# Patient Record
Sex: Female | Born: 1975 | Race: White | Hispanic: No | Marital: Single | Smoking: Never smoker
Health system: Southern US, Community
[De-identification: ages and names within clinical notes are randomized; demographics above are authoritative.]

## PROBLEM LIST (undated history)

## (undated) DIAGNOSIS — R0989 Other specified symptoms and signs involving the circulatory and respiratory systems: Secondary | ICD-10-CM

## (undated) DIAGNOSIS — I519 Heart disease, unspecified: Secondary | ICD-10-CM

## (undated) DIAGNOSIS — D472 Monoclonal gammopathy: Secondary | ICD-10-CM

## (undated) DIAGNOSIS — I351 Nonrheumatic aortic (valve) insufficiency: Secondary | ICD-10-CM

## (undated) DIAGNOSIS — Q231 Congenital insufficiency of aortic valve: Secondary | ICD-10-CM

## (undated) DIAGNOSIS — I77810 Thoracic aortic ectasia: Secondary | ICD-10-CM

## (undated) DIAGNOSIS — I2699 Other pulmonary embolism without acute cor pulmonale: Secondary | ICD-10-CM

## (undated) DIAGNOSIS — D229 Melanocytic nevi, unspecified: Secondary | ICD-10-CM

## (undated) DIAGNOSIS — L719 Rosacea, unspecified: Secondary | ICD-10-CM

## (undated) DIAGNOSIS — N904 Leukoplakia of vulva: Secondary | ICD-10-CM

## (undated) DIAGNOSIS — I10 Essential (primary) hypertension: Secondary | ICD-10-CM

## (undated) DIAGNOSIS — Q2381 Bicuspid aortic valve: Secondary | ICD-10-CM

## (undated) DIAGNOSIS — I350 Nonrheumatic aortic (valve) stenosis: Secondary | ICD-10-CM

## (undated) DIAGNOSIS — H6690 Otitis media, unspecified, unspecified ear: Secondary | ICD-10-CM

## (undated) DIAGNOSIS — E119 Type 2 diabetes mellitus without complications: Secondary | ICD-10-CM

## (undated) DIAGNOSIS — E669 Obesity, unspecified: Secondary | ICD-10-CM

## (undated) DIAGNOSIS — K589 Irritable bowel syndrome without diarrhea: Secondary | ICD-10-CM

## (undated) DIAGNOSIS — Q969 Turner's syndrome, unspecified: Secondary | ICD-10-CM

## (undated) HISTORY — DX: Nonrheumatic aortic (valve) insufficiency: I35.1

## (undated) HISTORY — DX: Other pulmonary embolism without acute cor pulmonale: I26.99

## (undated) HISTORY — DX: Bicuspid aortic valve: Q23.81

## (undated) HISTORY — DX: Nonrheumatic aortic (valve) stenosis: I35.0

## (undated) HISTORY — DX: Essential (primary) hypertension: I10

## (undated) HISTORY — DX: Congenital insufficiency of aortic valve: Q23.1

## (undated) HISTORY — DX: Leukoplakia of vulva: N90.4

## (undated) HISTORY — DX: Type 2 diabetes mellitus without complications: E11.9

## (undated) HISTORY — DX: Monoclonal gammopathy: D47.2

## (undated) HISTORY — DX: Melanocytic nevi, unspecified: D22.9

## (undated) HISTORY — DX: Obesity, unspecified: E66.9

## (undated) HISTORY — DX: Irritable bowel syndrome, unspecified: K58.9

## (undated) HISTORY — DX: Heart disease, unspecified: I51.9

## (undated) HISTORY — DX: Other specified symptoms and signs involving the circulatory and respiratory systems: R09.89

## (undated) HISTORY — DX: Otitis media, unspecified, unspecified ear: H66.90

## (undated) HISTORY — DX: Rosacea, unspecified: L71.9

## (undated) HISTORY — DX: Turner's syndrome, unspecified: Q96.9

## (undated) HISTORY — DX: Thoracic aortic ectasia: I77.810

---

## 1999-08-11 ENCOUNTER — Encounter: Admission: RE | Admit: 1999-08-11 | Discharge: 1999-11-09 | Payer: Self-pay | Admitting: Internal Medicine

## 1999-12-30 ENCOUNTER — Encounter: Admission: RE | Admit: 1999-12-30 | Discharge: 2000-03-29 | Payer: Self-pay | Admitting: Internal Medicine

## 2000-08-11 ENCOUNTER — Encounter: Admission: RE | Admit: 2000-08-11 | Discharge: 2000-11-09 | Payer: Self-pay | Admitting: Internal Medicine

## 2004-01-28 ENCOUNTER — Other Ambulatory Visit: Admission: RE | Admit: 2004-01-28 | Discharge: 2004-01-28 | Payer: Self-pay | Admitting: Internal Medicine

## 2005-03-02 ENCOUNTER — Other Ambulatory Visit: Admission: RE | Admit: 2005-03-02 | Discharge: 2005-03-02 | Payer: Self-pay | Admitting: Obstetrics and Gynecology

## 2006-03-29 ENCOUNTER — Other Ambulatory Visit: Admission: RE | Admit: 2006-03-29 | Discharge: 2006-03-29 | Payer: Self-pay | Admitting: Obstetrics and Gynecology

## 2007-04-04 ENCOUNTER — Other Ambulatory Visit: Admission: RE | Admit: 2007-04-04 | Discharge: 2007-04-04 | Payer: Self-pay | Admitting: Obstetrics and Gynecology

## 2008-04-09 ENCOUNTER — Other Ambulatory Visit: Admission: RE | Admit: 2008-04-09 | Discharge: 2008-04-09 | Payer: Self-pay | Admitting: Obstetrics and Gynecology

## 2009-04-12 ENCOUNTER — Other Ambulatory Visit: Admission: RE | Admit: 2009-04-12 | Discharge: 2009-04-12 | Payer: Self-pay | Admitting: Obstetrics and Gynecology

## 2010-05-09 ENCOUNTER — Other Ambulatory Visit: Admission: RE | Admit: 2010-05-09 | Discharge: 2010-05-09 | Payer: Self-pay | Admitting: Obstetrics and Gynecology

## 2011-01-01 ENCOUNTER — Other Ambulatory Visit: Payer: Self-pay | Admitting: Dermatology

## 2011-03-02 ENCOUNTER — Other Ambulatory Visit: Payer: Self-pay | Admitting: Dermatology

## 2011-05-11 ENCOUNTER — Other Ambulatory Visit (HOSPITAL_COMMUNITY)
Admission: RE | Admit: 2011-05-11 | Discharge: 2011-05-11 | Disposition: A | Discharge status: Home / Self Care | Payer: BC Managed Care – PPO | Source: Ambulatory Visit | Attending: Obstetrics and Gynecology | Admitting: Obstetrics and Gynecology

## 2011-05-11 ENCOUNTER — Other Ambulatory Visit: Payer: Self-pay | Admitting: Obstetrics and Gynecology

## 2011-05-11 DIAGNOSIS — Z01419 Encounter for gynecological examination (general) (routine) without abnormal findings: Secondary | ICD-10-CM | POA: Insufficient documentation

## 2012-03-04 ENCOUNTER — Other Ambulatory Visit: Payer: Self-pay | Admitting: Dermatology

## 2012-06-02 ENCOUNTER — Other Ambulatory Visit (HOSPITAL_COMMUNITY)
Admission: RE | Admit: 2012-06-02 | Discharge: 2012-06-02 | Disposition: A | Discharge status: Home / Self Care | Payer: BC Managed Care – PPO | Source: Ambulatory Visit | Attending: Obstetrics and Gynecology | Admitting: Obstetrics and Gynecology

## 2012-06-02 ENCOUNTER — Other Ambulatory Visit: Payer: Self-pay | Admitting: Obstetrics and Gynecology

## 2012-06-02 DIAGNOSIS — Z01419 Encounter for gynecological examination (general) (routine) without abnormal findings: Secondary | ICD-10-CM | POA: Insufficient documentation

## 2012-10-12 ENCOUNTER — Other Ambulatory Visit: Payer: Self-pay | Admitting: Dermatology

## 2013-05-26 ENCOUNTER — Other Ambulatory Visit: Payer: Self-pay | Admitting: Obstetrics and Gynecology

## 2013-05-26 ENCOUNTER — Other Ambulatory Visit (HOSPITAL_COMMUNITY)
Admission: RE | Admit: 2013-05-26 | Discharge: 2013-05-26 | Disposition: A | Discharge status: Home / Self Care | Payer: BC Managed Care – PPO | Source: Ambulatory Visit | Attending: Obstetrics and Gynecology | Admitting: Obstetrics and Gynecology

## 2013-05-26 DIAGNOSIS — Z1151 Encounter for screening for human papillomavirus (HPV): Secondary | ICD-10-CM | POA: Insufficient documentation

## 2013-05-26 DIAGNOSIS — Z01419 Encounter for gynecological examination (general) (routine) without abnormal findings: Secondary | ICD-10-CM | POA: Insufficient documentation

## 2014-05-28 ENCOUNTER — Other Ambulatory Visit (HOSPITAL_COMMUNITY)
Admission: RE | Admit: 2014-05-28 | Discharge: 2014-05-28 | Disposition: A | Discharge status: Home / Self Care | Payer: BC Managed Care – PPO | Source: Ambulatory Visit | Attending: Obstetrics and Gynecology | Admitting: Obstetrics and Gynecology

## 2014-05-28 ENCOUNTER — Other Ambulatory Visit: Payer: Self-pay | Admitting: Obstetrics and Gynecology

## 2014-05-28 DIAGNOSIS — Z01419 Encounter for gynecological examination (general) (routine) without abnormal findings: Secondary | ICD-10-CM | POA: Insufficient documentation

## 2014-05-30 ENCOUNTER — Other Ambulatory Visit: Payer: Self-pay

## 2014-05-30 DIAGNOSIS — Z1231 Encounter for screening mammogram for malignant neoplasm of breast: Secondary | ICD-10-CM

## 2014-05-30 LAB — CYTOLOGY - PAP

## 2014-06-18 ENCOUNTER — Ambulatory Visit
Admission: RE | Admit: 2014-06-18 | Discharge: 2014-06-18 | Disposition: A | Discharge status: Home / Self Care | Payer: BC Managed Care – PPO | Source: Ambulatory Visit

## 2014-06-18 DIAGNOSIS — Z1231 Encounter for screening mammogram for malignant neoplasm of breast: Secondary | ICD-10-CM

## 2014-06-29 ENCOUNTER — Other Ambulatory Visit: Payer: Self-pay | Admitting: Dermatology

## 2014-11-07 ENCOUNTER — Encounter: Payer: Self-pay | Admitting: *Deleted

## 2014-12-28 ENCOUNTER — Other Ambulatory Visit: Payer: Self-pay | Admitting: Dermatology

## 2015-01-03 ENCOUNTER — Encounter: Payer: Self-pay | Admitting: *Deleted

## 2015-04-19 ENCOUNTER — Other Ambulatory Visit: Payer: Self-pay | Admitting: Internal Medicine

## 2015-04-19 DIAGNOSIS — R748 Abnormal levels of other serum enzymes: Secondary | ICD-10-CM

## 2015-05-09 ENCOUNTER — Telehealth: Payer: Self-pay | Admitting: Hematology & Oncology

## 2015-05-09 NOTE — Telephone Encounter (Signed)
Contacted referring office(lt mess) for them to pls fax over recent labs on  Pt to schedule a new pt appt. Left mess with medical records

## 2015-05-27 ENCOUNTER — Ambulatory Visit
Admission: RE | Admit: 2015-05-27 | Discharge: 2015-05-27 | Disposition: A | Discharge status: Home / Self Care | Payer: BLUE CROSS/BLUE SHIELD | Source: Ambulatory Visit | Attending: Internal Medicine | Admitting: Internal Medicine

## 2015-05-27 DIAGNOSIS — R748 Abnormal levels of other serum enzymes: Secondary | ICD-10-CM

## 2015-05-31 ENCOUNTER — Other Ambulatory Visit: Payer: Self-pay | Admitting: Obstetrics and Gynecology

## 2015-05-31 ENCOUNTER — Other Ambulatory Visit (HOSPITAL_COMMUNITY)
Admission: RE | Admit: 2015-05-31 | Discharge: 2015-05-31 | Disposition: A | Discharge status: Home / Self Care | Payer: BLUE CROSS/BLUE SHIELD | Source: Ambulatory Visit | Attending: Obstetrics and Gynecology | Admitting: Obstetrics and Gynecology

## 2015-05-31 DIAGNOSIS — Z01419 Encounter for gynecological examination (general) (routine) without abnormal findings: Secondary | ICD-10-CM | POA: Diagnosis present

## 2015-05-31 DIAGNOSIS — N952 Postmenopausal atrophic vaginitis: Secondary | ICD-10-CM | POA: Insufficient documentation

## 2015-05-31 DIAGNOSIS — Z01411 Encounter for gynecological examination (general) (routine) with abnormal findings: Secondary | ICD-10-CM | POA: Insufficient documentation

## 2015-06-03 ENCOUNTER — Other Ambulatory Visit: Payer: Self-pay

## 2015-06-03 DIAGNOSIS — Z1231 Encounter for screening mammogram for malignant neoplasm of breast: Secondary | ICD-10-CM

## 2015-06-04 LAB — CYTOLOGY - PAP

## 2015-06-17 ENCOUNTER — Ambulatory Visit (HOSPITAL_BASED_OUTPATIENT_CLINIC_OR_DEPARTMENT_OTHER): Payer: BLUE CROSS/BLUE SHIELD

## 2015-06-17 ENCOUNTER — Ambulatory Visit (HOSPITAL_BASED_OUTPATIENT_CLINIC_OR_DEPARTMENT_OTHER): Payer: BLUE CROSS/BLUE SHIELD | Admitting: Hematology & Oncology

## 2015-06-17 ENCOUNTER — Ambulatory Visit: Payer: BLUE CROSS/BLUE SHIELD

## 2015-06-17 ENCOUNTER — Encounter: Payer: Self-pay | Admitting: Hematology & Oncology

## 2015-06-17 VITALS — BP 122/91 | HR 99 | Temp 97.9°F | Resp 18 | Ht 61.0 in | Wt 228.0 lb

## 2015-06-17 DIAGNOSIS — D472 Monoclonal gammopathy: Secondary | ICD-10-CM

## 2015-06-17 HISTORY — DX: Monoclonal gammopathy: D47.2

## 2015-06-17 LAB — CMP (CANCER CENTER ONLY)
ALT: 101 U/L — AB (ref 10–47)
AST: 56 U/L — ABNORMAL HIGH (ref 11–38)
Albumin: 3.6 g/dL (ref 3.3–5.5)
Alkaline Phosphatase: 122 U/L — ABNORMAL HIGH (ref 26–84)
BUN: 15 mg/dL (ref 7–22)
CALCIUM: 9.3 mg/dL (ref 8.0–10.3)
CHLORIDE: 107 meq/L (ref 98–108)
CO2: 24 mEq/L (ref 18–33)
Creat: 0.9 mg/dl (ref 0.6–1.2)
GLUCOSE: 150 mg/dL — AB (ref 73–118)
Potassium: 4 mEq/L (ref 3.3–4.7)
Sodium: 140 mEq/L (ref 128–145)
Total Bilirubin: 0.5 mg/dl (ref 0.20–1.60)
Total Protein: 7.4 g/dL (ref 6.4–8.1)

## 2015-06-17 LAB — CBC WITH DIFFERENTIAL (CANCER CENTER ONLY)
BASO#: 0 10*3/uL (ref 0.0–0.2)
BASO%: 0.4 % (ref 0.0–2.0)
EOS ABS: 0.1 10*3/uL (ref 0.0–0.5)
EOS%: 1.1 % (ref 0.0–7.0)
HEMATOCRIT: 42.2 % (ref 34.8–46.6)
HEMOGLOBIN: 14.9 g/dL (ref 11.6–15.9)
LYMPH#: 1.2 10*3/uL (ref 0.9–3.3)
LYMPH%: 21.2 % (ref 14.0–48.0)
MCH: 31.8 pg (ref 26.0–34.0)
MCHC: 35.3 g/dL (ref 32.0–36.0)
MCV: 90 fL (ref 81–101)
MONO#: 0.3 10*3/uL (ref 0.1–0.9)
MONO%: 5.6 % (ref 0.0–13.0)
NEUT%: 71.7 % (ref 39.6–80.0)
NEUTROS ABS: 4 10*3/uL (ref 1.5–6.5)
Platelets: 207 10*3/uL (ref 145–400)
RBC: 4.69 10*6/uL (ref 3.70–5.32)
RDW: 11.6 % (ref 11.1–15.7)
WBC: 5.5 10*3/uL (ref 3.9–10.0)

## 2015-06-17 NOTE — Progress Notes (Signed)
Referral MD  Reason for Referral: MGUS  Chief Complaint  Patient presents with  . OTHER    New Patient  : I have an abnormal protein my blood.  HPI: Ms. Lightcap is a very nice 39 year old white female. She has a history of Turner syndrome. She is followed by endocrinology for this.  She was found to have a pulmonary embolism about a year ago. She was on Xarelto for 6 months. At that time, she had been on estrogen therapy.  She was having some issues with elevated liver function tests. Dr. Buddy Duty, her endocrinologist, was very thorough and did an SPEP. Surprisingly enough, he felt that she had a minimal monoclonal spike of 0.2 g/dL. This was not further broken down.  Ms. Rena does have quite a few medical issues. She recently graduated from [employer] and took her bar exam. She is waiting to hear back for the results.  She has have psoriasis.  She has not had any issues with bleeding.  His been no change in bowel or bladder habits.  She has had multiple skin lesions removed which were not melanoma but pre-melanoma lesions. Her father had melanoma. There there is a history of multiple family members with cancer.  She does have her mammograms.  She's not noted any issues with infections.  She's not in any increasing fatigue or weakness.  She does not have any new bone, pain. She does have some occasional pain in the hands.  His not been any change in her medications.  We were asked to see her so that we might go to further evaluate this monoclonal spike.  Overall, her performance status is ECOG 0.   Past Medical History  Diagnosis Date  . Pulmonary embolism   . Turner syndrome   . Hypertension   . Bicuspid doming of aortic cusp   . Aortic stenosis   . Aortic insufficiency   . Left ventricular diastolic dysfunction     stage 1  . Pulmonary hyperinflation   . Mild dilation of ascending aorta   . Obesity   . Atypical nevi   . Recurrent otitis media   . IBS (irritable  bowel syndrome)     MILD  . Rosacea   . Vulvar dystrophy   . 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  amoxicillin (AMOXIL) 500 MG capsule, TAKE 4 CAPSULES BY MOUTH 1 HOUR BEFORE DENTAL APPOINTMENT, Disp: , Rfl: 0 .  Azelaic Acid (FINACEA) 15 % cream, , Disp: , Rfl:  .  cholecalciferol (VITAMIN D) 1000 UNITS tablet, , Disp: , Rfl:  .  doxycycline (VIBRAMYCIN) 100 MG capsule, , Disp: , Rfl:  .  lisinopril (PRINIVIL,ZESTRIL) 20 MG tablet, 1 tablet, Disp: , Rfl: :  :  Not on File:  Family History  Problem Relation Age of Onset  . Obesity Father   . Hypertension Father   . Diabetes Father   . Hypertension Mother   . Deep vein thrombosis Maternal Grandmother   . Colon cancer Maternal Aunt   :  Social History   Social History  . Marital Status: Single    Spouse Name: N/A  . Number of Children: N/A  . Years of Education: N/A   Occupational History  . Not on file.   Social History Main Topics  . Smoking status: Never Smoker   . Smokeless tobacco: Not on file  . Alcohol Use: Not  on file  . Drug Use: Not on file  . Sexual Activity: Not on file   Other Topics Concern  . Not on file   Social History Narrative  :  Pertinent items are noted in HPI.  Exam: @IPVITALS @  well-developed and well-nourished white female in no obvious distress. Vital signs show temperature of 97.9. Pulse 99. Blood pressure 122/91. Weight is 228 pounds. Head and neck exam shows no ocular or oral lesions. There are no palpable cervical or supraclavicular lymph nodes. Lungs are clear. Cardiac exam regular rate and rhythm.  She is a 1/6 systolic ejection murmur. Abdomen is soft. She is mildly obese. She has no fluid wave. There is no palpable liver or spleen tip. Back exam shows no tenderness over the spine, ribs or hips. Extremities shows no clubbing, cyanosis or edema. She has good range of motion of  her joints. Skin exam shows some hyperpigmented lesions on her trunk. None appear suspicious. She does have a psoriatic plaque under the right ear and on her right elbow. Neurological exam is nonfocal.    Recent Labs  06/17/15 1339  WBC 5.5  HGB 14.9  HCT 42.2  PLT 207    Recent Labs  06/17/15 1339  NA 140  K 4.0  CL 107  CO2 24  GLUCOSE 150*  BUN 15  CREATININE 0.9  CALCIUM 9.3    Blood smear review: Normochromic and normocytic population of red blood cells. She has no nucleated red blood cells. There are no teardrop cells. She has no rouleau formation. There are no target cells. She has no schistocytes or spherocytes. White blood cells per normal in morphology maturation. She has no immature myeloid lymphoid forms. There are no hypersegmented polys. Platelets are adequate in number and size.  Pathology: None     Assessment and Plan:  Ms. Peach is a very charming 39 year old white female. She has a minimal monoclonal spike. I would put this as MGUS. I think that this is not too surprising. I think the only thing that is surprising is that she is young. However, the fact that she has multiple health issues could certainly cause the body to react to chronic inflammation and previous and MGUS.  I am going to do a 24-hour urine on her. I want to make sure that she does not have light chains that could be missed on blood studies.  I don't think we have to do any bone surveys.  I don't believe that she needs a bone marrow biopsy.  With MGUS, there is a chance of this progressing to myeloma. Again, she is quite young and this would be the issue we have to watch out for.  I spent a good 45 minutes with her. I went over her lab work. She has a very good understanding of what is going on.  I will plan to see her back in about 6 months.

## 2015-06-19 LAB — PROTEIN ELECTROPHORESIS, SERUM, WITH REFLEX
ALPHA-2-GLOBULIN: 0.7 g/dL (ref 0.5–0.9)
Albumin ELP: 4.1 g/dL (ref 3.8–4.8)
Alpha-1-Globulin: 0.3 g/dL (ref 0.2–0.3)
BETA 2: 0.5 g/dL (ref 0.2–0.5)
BETA GLOBULIN: 0.4 g/dL (ref 0.4–0.6)
Gamma Globulin: 0.9 g/dL (ref 0.8–1.7)
TOTAL PROTEIN, SERUM ELECTROPHOR: 6.8 g/dL (ref 6.1–8.1)

## 2015-06-19 LAB — BETA 2 MICROGLOBULIN, SERUM: Beta-2 Microglobulin: 1.83 mg/L (ref <=2.51)

## 2015-06-19 LAB — KAPPA/LAMBDA LIGHT CHAINS
KAPPA FREE LGHT CHN: 1.46 mg/dL (ref 0.33–1.94)
Kappa:Lambda Ratio: 0.61 (ref 0.26–1.65)
Lambda Free Lght Chn: 2.38 mg/dL (ref 0.57–2.63)

## 2015-06-19 LAB — IGG, IGA, IGM
IGM, SERUM: 49 mg/dL — AB (ref 52–322)
IgA: 363 mg/dL (ref 69–380)
IgG (Immunoglobin G), Serum: 871 mg/dL (ref 690–1700)

## 2015-06-19 LAB — LACTATE DEHYDROGENASE: LDH: 182 U/L (ref 94–250)

## 2015-06-20 ENCOUNTER — Telehealth: Payer: Self-pay | Admitting: *Deleted

## 2015-06-20 NOTE — Telephone Encounter (Addendum)
Patient aware of results.   ----- Message from Volanda Napoleon, MD sent at 06/19/2015  6:09 PM EDT ----- Call and let her know that the blood work still looks okay. I don't see anything that looks serious or anything that looks like malignant. Thanks

## 2015-06-27 ENCOUNTER — Telehealth: Payer: Self-pay | Admitting: *Deleted

## 2015-06-27 LAB — 24 HR URINE,KAPPA/LAMBDA LIGHT CHAINS
Measured Lambda Chain: <0.4 mg/dL (ref <2.00)
URINE VOLUME: 3000 mL/(24.h)

## 2015-06-27 LAB — UIFE/LIGHT CHAINS/TP QN, 24-HR UR
Albumin, U: DETECTED
Time: 24 hours
Total Protein, Urine: <4 mg/dL — ABNORMAL LOW (ref 5–24)
Volume, Urine: 3000 mL

## 2015-06-27 NOTE — Telephone Encounter (Addendum)
Patient aware of results.   ----- Message from Volanda Napoleon, MD sent at 06/26/2015  5:35 PM EDT ----- Call - urine is normal!!!  Tiffany Cortez

## 2015-07-12 ENCOUNTER — Ambulatory Visit
Admission: RE | Admit: 2015-07-12 | Discharge: 2015-07-12 | Disposition: A | Discharge status: Home / Self Care | Payer: BLUE CROSS/BLUE SHIELD | Source: Ambulatory Visit

## 2015-07-12 DIAGNOSIS — Z1231 Encounter for screening mammogram for malignant neoplasm of breast: Secondary | ICD-10-CM

## 2015-07-18 ENCOUNTER — Telehealth: Payer: Self-pay | Admitting: Hematology & Oncology

## 2015-07-18 NOTE — Telephone Encounter (Signed)
Patient called and cx 12/16/15 appt and resch for 12/23/15

## 2015-12-16 ENCOUNTER — Other Ambulatory Visit: Payer: BLUE CROSS/BLUE SHIELD

## 2015-12-16 ENCOUNTER — Ambulatory Visit: Payer: BLUE CROSS/BLUE SHIELD | Admitting: Hematology & Oncology

## 2015-12-23 ENCOUNTER — Ambulatory Visit (HOSPITAL_BASED_OUTPATIENT_CLINIC_OR_DEPARTMENT_OTHER): Payer: BLUE CROSS/BLUE SHIELD | Admitting: Hematology & Oncology

## 2015-12-23 ENCOUNTER — Encounter: Payer: Self-pay | Admitting: Hematology & Oncology

## 2015-12-23 ENCOUNTER — Other Ambulatory Visit (HOSPITAL_BASED_OUTPATIENT_CLINIC_OR_DEPARTMENT_OTHER): Payer: BLUE CROSS/BLUE SHIELD

## 2015-12-23 VITALS — BP 119/64 | HR 85 | Temp 97.7°F | Resp 16 | Ht 61.0 in | Wt 224.0 lb

## 2015-12-23 DIAGNOSIS — D472 Monoclonal gammopathy: Secondary | ICD-10-CM

## 2015-12-23 DIAGNOSIS — Q969 Turner's syndrome, unspecified: Secondary | ICD-10-CM

## 2015-12-23 DIAGNOSIS — Z86711 Personal history of pulmonary embolism: Secondary | ICD-10-CM | POA: Diagnosis not present

## 2015-12-23 LAB — COMPREHENSIVE METABOLIC PANEL (CC13)
A/G RATIO: 1.5 (ref 1.1–2.5)
ALT: 98 IU/L — AB (ref 0–32)
AST: 46 IU/L — AB (ref 0–40)
Albumin, Serum: 4.1 g/dL (ref 3.5–5.5)
Alkaline Phosphatase, S: 116 IU/L (ref 39–117)
BUN/Creatinine Ratio: 16 (ref 9–23)
BUN: 13 mg/dL (ref 6–24)
Bilirubin Total: 0.3 mg/dL (ref 0.0–1.2)
CHLORIDE: 104 mmol/L (ref 96–106)
CO2: 21 mmol/L (ref 18–29)
Calcium, Ser: 9.4 mg/dL (ref 8.7–10.2)
Creatinine, Ser: 0.83 mg/dL (ref 0.57–1.00)
GFR calc Af Amer: 101 mL/min/{1.73_m2} (ref >59)
GFR calc non Af Amer: 88 mL/min/{1.73_m2} (ref >59)
GLOBULIN, TOTAL: 2.8 g/dL (ref 1.5–4.5)
Glucose: 162 mg/dL — ABNORMAL HIGH (ref 65–99)
POTASSIUM: 4 mmol/L (ref 3.5–5.2)
Sodium: 136 mmol/L (ref 134–144)
Total Protein: 6.9 g/dL (ref 6.0–8.5)

## 2015-12-23 LAB — CBC WITH DIFFERENTIAL (CANCER CENTER ONLY)
BASO#: 0 10*3/uL (ref 0.0–0.2)
BASO%: 0.2 % (ref 0.0–2.0)
EOS%: 1.2 % (ref 0.0–7.0)
Eosinophils Absolute: 0.1 10*3/uL (ref 0.0–0.5)
HCT: 40.2 % (ref 34.8–46.6)
HGB: 14 g/dL (ref 11.6–15.9)
LYMPH#: 1.4 10*3/uL (ref 0.9–3.3)
LYMPH%: 21.4 % (ref 14.0–48.0)
MCH: 31 pg (ref 26.0–34.0)
MCHC: 34.8 g/dL (ref 32.0–36.0)
MCV: 89 fL (ref 81–101)
MONO#: 0.3 10*3/uL (ref 0.1–0.9)
MONO%: 5.1 % (ref 0.0–13.0)
NEUT#: 4.6 10*3/uL (ref 1.5–6.5)
NEUT%: 72.1 % (ref 39.6–80.0)
Platelets: 232 10*3/uL (ref 145–400)
RBC: 4.52 10*6/uL (ref 3.70–5.32)
RDW: 11.8 % (ref 11.1–15.7)
WBC: 6.4 10*3/uL (ref 3.9–10.0)

## 2015-12-23 NOTE — Progress Notes (Signed)
Hematology and Oncology Follow Up Visit  Tiffany Cortez [id number] 1976-05-23 40 y.o. 12/23/2015   Principle Diagnosis:   Transient MGUS  Current Therapy:    Observation     Interim History:  Tiffany Cortez is back for second office visit. We saw her in August. At that point time, we did a full evaluation for the MGUS. She apparently was found to have an MGUS by her family doctor. However, we checked her SPEP, there is no monoclonal spike. I then did a 24-hour urine for quantitative immunoglobulins. The light chains were negative in the urine.  Her quantitative immunoglobulin levels have been okay. Her kappa and lambda light chains were also normal.  I do not feel that we had to do a bone survey. She definitely did not need a bone marrow biopsy.  Since she last saw her, she been doing okay. She is working part-time. 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 She took her a bar exam. I do see if she passed it.  She's had some problems with eczema. She is on some cream for this.  She's had no fever. She's had no change in bowel or bladder habits. She does get her mammograms. Her last mammogram was in September 2016 and turned out okay.  She's had no leg swelling. She's had no joint problems.  Overall, her performance status is ECOG 0.   Medications:  Current outpatient prescriptions:  .  amoxicillin (AMOXIL) 500 MG capsule, TAKE 4 CAPSULES BY MOUTH 1 HOUR BEFORE DENTAL APPOINTMENT, Disp: , Rfl: 0 .  Azelaic Acid (FINACEA) 15 % cream, , Disp: , Rfl:  .  cholecalciferol (VITAMIN D) 1000 UNITS tablet, , Disp: , Rfl:  .  doxycycline (VIBRAMYCIN) 100 MG capsule, , Disp: , Rfl:  .  lisinopril (PRINIVIL,ZESTRIL) 20 MG tablet, 1 tablet, Disp: , Rfl:   Allergies: Not on File  Past Medical History, Surgical history, Social history, and Family History were reviewed and updated.  Review of Systems: As above  Physical Exam:  height is 5' 1"  (1.549 m) and weight is 224 lb (101.606 kg). Her oral temperature is 97.7  F (36.5 C). Her blood pressure is 119/64 and her pulse is 85. Her respiration is 16.   Wt Readings from Last 3 Encounters:  12/23/15 224 lb (101.606 kg)  06/17/15 228 lb (103.42 kg)     Head and neck exam shows no ocular or oral lesions. There are no palpable cervical or supraclavicular lymph nodes. Lungs are clear. Cardiac exam regular rate and rhythm.  She is a 1/6 systolic ejection murmur. Abdomen is soft. She is mildly obese. She has no fluid wave. There is no palpable liver or spleen tip. Back exam shows no tenderness over the spine, ribs or hips. Extremities shows no clubbing, cyanosis or edema. She has good range of motion of her joints. Skin exam shows some hyperpigmented lesions on her trunk. None appear suspicious. She does have a psoriatic plaque under the right ear and on her right elbow. Neurological exam is nonfocal.   Lab Results  Component Value Date   WBC 6.4 12/23/2015   HGB 14.0 12/23/2015   HCT 40.2 12/23/2015   MCV 89 12/23/2015   PLT 232 12/23/2015     Chemistry      Component Value Date/Time   NA 140 06/17/2015 1339   K 4.0 06/17/2015 1339   CL 107 06/17/2015 1339   CO2 24 06/17/2015 1339   BUN 15 06/17/2015 1339   CREATININE 0.9 06/17/2015 1339  Component Value Date/Time   CALCIUM 9.3 06/17/2015 1339   ALKPHOS 122* 06/17/2015 1339   AST 56* 06/17/2015 1339   ALT 101* 06/17/2015 1339   BILITOT 0.50 06/17/2015 1339         Impression and Plan: Tiffany Cortez is a 40 year old white female. She has Turner's syndrome. She did have a past history of pulmonary embolism. She had been on estrogen therapy.  I cannot find any obvious plasmacytic process. We will see what her M spike shows today.  I have to believe that any M spike/MGUS that she had is probably reactionary to her other health issues.  I think we probably follow her along yearly. I think this would be reasonable.  I spent about 30 minutes with her today. It is a lot of on talking with her.  I reviewed the labs that we ran on her back in August.   Volanda Napoleon, MD 2/27/20173:29 PM

## 2015-12-24 LAB — IGG, IGA, IGM
IGM (IMMUNOGLOBIN M), SRM: 48 mg/dL (ref 26–217)
IgA, Qn, Serum: 346 mg/dL (ref 87–352)
IgG, Qn, Serum: 825 mg/dL (ref 700–1600)

## 2015-12-24 LAB — KAPPA/LAMBDA LIGHT CHAINS
IG KAPPA FREE LIGHT CHAIN: 16.53 mg/L (ref 3.30–19.40)
Ig Lambda Free Light Chain: 15.9 mg/L (ref 5.71–26.30)
Kappa/Lambda FluidC Ratio: 1.04 (ref 0.26–1.65)

## 2015-12-25 LAB — PROTEIN ELECTROPHORESIS, SERUM, WITH REFLEX
A/G Ratio: 1.3 (ref 0.7–1.7)
ALPHA 1: 0.2 g/dL (ref 0.0–0.4)
ALPHA 2: 0.6 g/dL (ref 0.4–1.0)
Albumin: 3.7 g/dL (ref 2.9–4.4)
Beta: 1.2 g/dL (ref 0.7–1.3)
Gamma Globulin: 0.8 g/dL (ref 0.4–1.8)
Globulin, Total: 2.8 g/dL (ref 2.2–3.9)
TOTAL PROTEIN: 6.5 g/dL (ref 6.0–8.5)

## 2015-12-26 ENCOUNTER — Telehealth: Payer: Self-pay | Admitting: *Deleted

## 2015-12-26 NOTE — Telephone Encounter (Signed)
-----   Message from Volanda Napoleon, MD sent at 12/26/2015  7:01 AM EST ----- Call - No abnormal protein in the blood!! pete

## 2016-03-06 DIAGNOSIS — D2261 Melanocytic nevi of right upper limb, including shoulder: Secondary | ICD-10-CM | POA: Diagnosis not present

## 2016-03-06 DIAGNOSIS — K760 Fatty (change of) liver, not elsewhere classified: Secondary | ICD-10-CM | POA: Diagnosis not present

## 2016-03-06 DIAGNOSIS — D2262 Melanocytic nevi of left upper limb, including shoulder: Secondary | ICD-10-CM | POA: Diagnosis not present

## 2016-03-06 DIAGNOSIS — D224 Melanocytic nevi of scalp and neck: Secondary | ICD-10-CM | POA: Diagnosis not present

## 2016-03-06 DIAGNOSIS — L308 Other specified dermatitis: Secondary | ICD-10-CM | POA: Diagnosis not present

## 2016-03-06 DIAGNOSIS — H16403 Unspecified corneal neovascularization, bilateral: Secondary | ICD-10-CM | POA: Diagnosis not present

## 2016-03-06 DIAGNOSIS — H01001 Unspecified blepharitis right upper eyelid: Secondary | ICD-10-CM | POA: Diagnosis not present

## 2016-03-06 DIAGNOSIS — Z0000 Encounter for general adult medical examination without abnormal findings: Secondary | ICD-10-CM | POA: Diagnosis not present

## 2016-03-06 DIAGNOSIS — Q231 Congenital insufficiency of aortic valve: Secondary | ICD-10-CM | POA: Diagnosis not present

## 2016-03-06 DIAGNOSIS — I10 Essential (primary) hypertension: Secondary | ICD-10-CM | POA: Diagnosis not present

## 2016-06-05 ENCOUNTER — Other Ambulatory Visit: Payer: Self-pay | Admitting: Obstetrics and Gynecology

## 2016-06-05 DIAGNOSIS — Q969 Turner's syndrome, unspecified: Secondary | ICD-10-CM | POA: Diagnosis not present

## 2016-06-05 DIAGNOSIS — R748 Abnormal levels of other serum enzymes: Secondary | ICD-10-CM | POA: Diagnosis not present

## 2016-06-05 DIAGNOSIS — Q231 Congenital insufficiency of aortic valve: Secondary | ICD-10-CM | POA: Diagnosis not present

## 2016-06-05 DIAGNOSIS — Z1231 Encounter for screening mammogram for malignant neoplasm of breast: Secondary | ICD-10-CM

## 2016-06-22 ENCOUNTER — Other Ambulatory Visit: Payer: Self-pay | Admitting: Obstetrics and Gynecology

## 2016-06-22 ENCOUNTER — Other Ambulatory Visit (HOSPITAL_COMMUNITY)
Admission: RE | Admit: 2016-06-22 | Discharge: 2016-06-22 | Disposition: A | Discharge status: Home / Self Care | Payer: BLUE CROSS/BLUE SHIELD | Source: Ambulatory Visit | Attending: Obstetrics and Gynecology | Admitting: Obstetrics and Gynecology

## 2016-06-22 DIAGNOSIS — Z01419 Encounter for gynecological examination (general) (routine) without abnormal findings: Secondary | ICD-10-CM | POA: Diagnosis not present

## 2016-06-22 DIAGNOSIS — Z1151 Encounter for screening for human papillomavirus (HPV): Secondary | ICD-10-CM | POA: Diagnosis not present

## 2016-07-02 LAB — CYTOLOGY - PAP

## 2016-07-13 ENCOUNTER — Ambulatory Visit
Admission: RE | Admit: 2016-07-13 | Discharge: 2016-07-13 | Disposition: A | Discharge status: Home / Self Care | Payer: BLUE CROSS/BLUE SHIELD | Source: Ambulatory Visit | Attending: Obstetrics and Gynecology | Admitting: Obstetrics and Gynecology

## 2016-07-13 DIAGNOSIS — Z1231 Encounter for screening mammogram for malignant neoplasm of breast: Secondary | ICD-10-CM

## 2016-09-11 DIAGNOSIS — D485 Neoplasm of uncertain behavior of skin: Secondary | ICD-10-CM | POA: Diagnosis not present

## 2016-09-11 DIAGNOSIS — D2262 Melanocytic nevi of left upper limb, including shoulder: Secondary | ICD-10-CM | POA: Diagnosis not present

## 2016-09-11 DIAGNOSIS — L400 Psoriasis vulgaris: Secondary | ICD-10-CM | POA: Diagnosis not present

## 2016-09-11 DIAGNOSIS — D225 Melanocytic nevi of trunk: Secondary | ICD-10-CM | POA: Diagnosis not present

## 2016-09-11 DIAGNOSIS — D2261 Melanocytic nevi of right upper limb, including shoulder: Secondary | ICD-10-CM | POA: Diagnosis not present

## 2016-10-30 DIAGNOSIS — H01004 Unspecified blepharitis left upper eyelid: Secondary | ICD-10-CM | POA: Diagnosis not present

## 2016-10-30 DIAGNOSIS — H16403 Unspecified corneal neovascularization, bilateral: Secondary | ICD-10-CM | POA: Diagnosis not present

## 2016-10-30 DIAGNOSIS — H5213 Myopia, bilateral: Secondary | ICD-10-CM | POA: Diagnosis not present

## 2016-10-30 DIAGNOSIS — H01001 Unspecified blepharitis right upper eyelid: Secondary | ICD-10-CM | POA: Diagnosis not present

## 2016-11-27 DIAGNOSIS — R7301 Impaired fasting glucose: Secondary | ICD-10-CM | POA: Diagnosis not present

## 2016-11-27 DIAGNOSIS — Q969 Turner's syndrome, unspecified: Secondary | ICD-10-CM | POA: Diagnosis not present

## 2016-12-04 DIAGNOSIS — Q231 Congenital insufficiency of aortic valve: Secondary | ICD-10-CM | POA: Diagnosis not present

## 2016-12-04 DIAGNOSIS — Q969 Turner's syndrome, unspecified: Secondary | ICD-10-CM | POA: Diagnosis not present

## 2016-12-04 DIAGNOSIS — I350 Nonrheumatic aortic (valve) stenosis: Secondary | ICD-10-CM | POA: Diagnosis not present

## 2016-12-04 DIAGNOSIS — Z86711 Personal history of pulmonary embolism: Secondary | ICD-10-CM | POA: Diagnosis not present

## 2016-12-21 ENCOUNTER — Ambulatory Visit: Payer: BLUE CROSS/BLUE SHIELD | Admitting: Hematology & Oncology

## 2016-12-21 ENCOUNTER — Other Ambulatory Visit: Payer: BLUE CROSS/BLUE SHIELD

## 2016-12-23 ENCOUNTER — Other Ambulatory Visit: Payer: BLUE CROSS/BLUE SHIELD

## 2016-12-23 ENCOUNTER — Ambulatory Visit: Payer: BLUE CROSS/BLUE SHIELD | Admitting: Hematology & Oncology

## 2016-12-25 ENCOUNTER — Other Ambulatory Visit: Payer: Self-pay | Admitting: *Deleted

## 2016-12-25 DIAGNOSIS — D472 Monoclonal gammopathy: Secondary | ICD-10-CM

## 2016-12-28 ENCOUNTER — Ambulatory Visit (HOSPITAL_BASED_OUTPATIENT_CLINIC_OR_DEPARTMENT_OTHER): Payer: BLUE CROSS/BLUE SHIELD | Admitting: Hematology & Oncology

## 2016-12-28 ENCOUNTER — Other Ambulatory Visit (HOSPITAL_BASED_OUTPATIENT_CLINIC_OR_DEPARTMENT_OTHER): Payer: BLUE CROSS/BLUE SHIELD

## 2016-12-28 VITALS — BP 115/80 | HR 78 | Temp 97.9°F | Resp 16 | Wt 223.0 lb

## 2016-12-28 DIAGNOSIS — D472 Monoclonal gammopathy: Secondary | ICD-10-CM

## 2016-12-28 DIAGNOSIS — I10 Essential (primary) hypertension: Secondary | ICD-10-CM | POA: Insufficient documentation

## 2016-12-28 DIAGNOSIS — Z86711 Personal history of pulmonary embolism: Secondary | ICD-10-CM | POA: Diagnosis not present

## 2016-12-28 DIAGNOSIS — I350 Nonrheumatic aortic (valve) stenosis: Secondary | ICD-10-CM | POA: Insufficient documentation

## 2016-12-28 DIAGNOSIS — Q969 Turner's syndrome, unspecified: Secondary | ICD-10-CM

## 2016-12-28 DIAGNOSIS — J309 Allergic rhinitis, unspecified: Secondary | ICD-10-CM | POA: Insufficient documentation

## 2016-12-28 DIAGNOSIS — Q231 Congenital insufficiency of aortic valve: Secondary | ICD-10-CM | POA: Insufficient documentation

## 2016-12-28 DIAGNOSIS — L719 Rosacea, unspecified: Secondary | ICD-10-CM | POA: Insufficient documentation

## 2016-12-28 DIAGNOSIS — I700 Atherosclerosis of aorta: Secondary | ICD-10-CM | POA: Insufficient documentation

## 2016-12-28 DIAGNOSIS — K589 Irritable bowel syndrome without diarrhea: Secondary | ICD-10-CM | POA: Insufficient documentation

## 2016-12-28 DIAGNOSIS — K8020 Calculus of gallbladder without cholecystitis without obstruction: Secondary | ICD-10-CM | POA: Insufficient documentation

## 2016-12-28 DIAGNOSIS — K760 Fatty (change of) liver, not elsewhere classified: Secondary | ICD-10-CM | POA: Insufficient documentation

## 2016-12-28 DIAGNOSIS — D229 Melanocytic nevi, unspecified: Secondary | ICD-10-CM | POA: Insufficient documentation

## 2016-12-28 LAB — COMPREHENSIVE METABOLIC PANEL
ALBUMIN: 3.8 g/dL (ref 3.5–5.0)
ALK PHOS: 121 U/L (ref 40–150)
ALT: 88 U/L — ABNORMAL HIGH (ref 0–55)
ANION GAP: 9 meq/L (ref 3–11)
AST: 39 U/L — ABNORMAL HIGH (ref 5–34)
BILIRUBIN TOTAL: 0.35 mg/dL (ref 0.20–1.20)
BUN: 15.2 mg/dL (ref 7.0–26.0)
CO2: 25 mEq/L (ref 22–29)
Calcium: 9.3 mg/dL (ref 8.4–10.4)
Chloride: 107 mEq/L (ref 98–109)
Creatinine: 0.7 mg/dL (ref 0.6–1.1)
Glucose: 136 mg/dl (ref 70–140)
Potassium: 4 mEq/L (ref 3.5–5.1)
SODIUM: 141 meq/L (ref 136–145)
TOTAL PROTEIN: 7.1 g/dL (ref 6.4–8.3)

## 2016-12-28 LAB — CBC WITH DIFFERENTIAL (CANCER CENTER ONLY)
BASO#: 0 10*3/uL (ref 0.0–0.2)
BASO%: 0.4 % (ref 0.0–2.0)
EOS%: 2.2 % (ref 0.0–7.0)
Eosinophils Absolute: 0.1 10*3/uL (ref 0.0–0.5)
HEMATOCRIT: 41.7 % (ref 34.8–46.6)
HEMOGLOBIN: 14.6 g/dL (ref 11.6–15.9)
LYMPH#: 1.3 10*3/uL (ref 0.9–3.3)
LYMPH%: 24.7 % (ref 14.0–48.0)
MCH: 31.6 pg (ref 26.0–34.0)
MCHC: 35 g/dL (ref 32.0–36.0)
MCV: 90 fL (ref 81–101)
MONO#: 0.4 10*3/uL (ref 0.1–0.9)
MONO%: 7.4 % (ref 0.0–13.0)
NEUT%: 65.3 % (ref 39.6–80.0)
NEUTROS ABS: 3.5 10*3/uL (ref 1.5–6.5)
Platelets: 198 10*3/uL (ref 145–400)
RBC: 4.62 10*6/uL (ref 3.70–5.32)
RDW: 11.7 % (ref 11.1–15.7)
WBC: 5.4 10*3/uL (ref 3.9–10.0)

## 2016-12-28 NOTE — Progress Notes (Signed)
Hematology and Oncology Follow Up Visit  Tiffany Cortez [id number] 09/22/1976 41 y.o. 12/28/2016   Principle Diagnosis:   Transient MGUS  Current Therapy:    Observation     Interim History:  Tiffany Cortez is back for follow-up. We see her once a year. Would last saw her a year ago, there was no monoclonal spike noted.  She took the [employer] exam. Hopefully she will pass it so that she will be able to get a full-time job.   She's had no problems with infections. She has had no problems with weight loss or weight gain.  She does have Turner's syndrome. This seems to be doing pretty well.  She does have eczema  She is not exercising like she would like to. Hopefully she will exercise a little bit more this year.   She's had no problems with fever. She's had no problem with influenza.   There's been no change in bowel or bladder habits.  Her last mammogram was in September 2017. This looked fine.   Overall, her performance status is ECOG 0.   Medications:  Current Outpatient Prescriptions:  .  amoxicillin (AMOXIL) 500 MG capsule, TAKE 4 CAPSULES BY MOUTH 1 HOUR BEFORE DENTAL APPOINTMENT, Disp: , Rfl: 0 .  Azelaic Acid (FINACEA) 15 % cream, , Disp: , Rfl:  .  cholecalciferol (VITAMIN D) 1000 UNITS tablet, , Disp: , Rfl:  .  doxycycline (VIBRAMYCIN) 100 MG capsule, , Disp: , Rfl:  .  lisinopril (PRINIVIL,ZESTRIL) 20 MG tablet, 1 tablet, Disp: , Rfl:   Allergies: Not on File  Past Medical History, Surgical history, Social history, and Family History were reviewed and updated.  Review of Systems: As above  Physical Exam:  weight is 223 lb (101.2 kg). Her oral temperature is 97.9 F (36.6 C). Her blood pressure is 115/80 and her pulse is 78. Her respiration is 16 and oxygen saturation is 98%.   Wt Readings from Last 3 Encounters:  12/28/16 223 lb (101.2 kg)  12/23/15 224 lb (101.6 kg)  06/17/15 228 lb (103.4 kg)     Head and neck exam shows no ocular or oral lesions. There are  no palpable cervical or supraclavicular lymph nodes. Lungs are clear. Cardiac exam regular rate and rhythm.  She is a 1/6 systolic ejection murmur. Abdomen is soft. She is mildly obese. She has no fluid wave. There is no palpable liver or spleen tip. Back exam shows no tenderness over the spine, ribs or hips. Extremities shows no clubbing, cyanosis or edema. She has good range of motion of her joints. Skin exam shows some hyperpigmented lesions on her trunk. None appear suspicious. She does have a psoriatic plaque under the right ear and on her right elbow. Neurological exam is nonfocal.   Lab Results  Component Value Date   WBC 5.4 12/28/2016   HGB 14.6 12/28/2016   HCT 41.7 12/28/2016   MCV 90 12/28/2016   PLT 198 12/28/2016     Chemistry      Component Value Date/Time   NA 136 12/23/2015 1438   NA 140 06/17/2015 1339   K 4.0 12/23/2015 1438   K 4.0 06/17/2015 1339   CL 104 12/23/2015 1438   CL 107 06/17/2015 1339   CO2 21 12/23/2015 1438   CO2 24 06/17/2015 1339   BUN 13 12/23/2015 1438   BUN 15 06/17/2015 1339   CREATININE 0.83 12/23/2015 1438   CREATININE 0.9 06/17/2015 1339      Component Value Date/Time  CALCIUM 9.4 12/23/2015 1438   CALCIUM 9.3 06/17/2015 1339   ALKPHOS 116 12/23/2015 1438   ALKPHOS 122 (H) 06/17/2015 1339   AST 46 (H) 12/23/2015 1438   AST 56 (H) 06/17/2015 1339   ALT 98 (H) 12/23/2015 1438   ALT 101 (H) 06/17/2015 1339   BILITOT 0.3 12/23/2015 1438   BILITOT 0.50 06/17/2015 1339         Impression and Plan: Tiffany Cortez is a 41 year old white female. She has Turner's syndrome. She did have a past history of pulmonary embolism. She had been on estrogen therapy.  I cannot find any obvious plasmacytic process. There was no M spike we last saw her.  For now, I do still think that we have to see her back. I'm not sure how much we really are contributing to her overall health care. I do not see anything that looks like a plasma cell disorder.   I  told her that we would be when having to see her back at any time if anything does arise in the future.   We will pray for her that she passes the Bar exam.   Volanda Napoleon, MD 3/5/20189:11 AM

## 2016-12-29 LAB — PROTEIN ELECTROPHORESIS, SERUM
A/G Ratio: 1.3 (ref 0.7–1.7)
ALPHA 1: 0.2 g/dL (ref 0.0–0.4)
ALPHA 2: 0.7 g/dL (ref 0.4–1.0)
Albumin: 3.7 g/dL (ref 2.9–4.4)
Beta: 1.2 g/dL (ref 0.7–1.3)
GAMMA GLOBULIN: 0.8 g/dL (ref 0.4–1.8)
GLOBULIN, TOTAL: 2.9 g/dL (ref 2.2–3.9)
TOTAL PROTEIN: 6.6 g/dL (ref 6.0–8.5)

## 2016-12-29 LAB — KAPPA/LAMBDA LIGHT CHAINS
IG KAPPA FREE LIGHT CHAIN: 13.4 mg/L (ref 3.3–19.4)
Ig Lambda Free Light Chain: 11.7 mg/L (ref 5.7–26.3)
Kappa/Lambda FluidC Ratio: 1.15 (ref 0.26–1.65)

## 2016-12-29 LAB — IGG, IGA, IGM
IGM (IMMUNOGLOBIN M), SRM: 54 mg/dL (ref 26–217)
IgA, Qn, Serum: 293 mg/dL (ref 87–352)
IgG, Qn, Serum: 744 mg/dL (ref 700–1600)

## 2016-12-29 LAB — BETA 2 MICROGLOBULIN, SERUM: Beta-2: 1.6 mg/L (ref 0.6–2.4)

## 2016-12-30 ENCOUNTER — Telehealth: Payer: Self-pay | Admitting: *Deleted

## 2016-12-30 NOTE — Telephone Encounter (Addendum)
Patient aware of results  ----- Message from Volanda Napoleon, MD sent at 12/30/2016  5:51 AM EST ----- Call - Still no abnormal protein!!!!  pete

## 2017-03-08 DIAGNOSIS — I10 Essential (primary) hypertension: Secondary | ICD-10-CM | POA: Diagnosis not present

## 2017-03-08 DIAGNOSIS — Q231 Congenital insufficiency of aortic valve: Secondary | ICD-10-CM | POA: Diagnosis not present

## 2017-03-08 DIAGNOSIS — I350 Nonrheumatic aortic (valve) stenosis: Secondary | ICD-10-CM | POA: Diagnosis not present

## 2017-05-21 DIAGNOSIS — H01001 Unspecified blepharitis right upper eyelid: Secondary | ICD-10-CM | POA: Diagnosis not present

## 2017-05-21 DIAGNOSIS — H01004 Unspecified blepharitis left upper eyelid: Secondary | ICD-10-CM | POA: Diagnosis not present

## 2017-05-21 DIAGNOSIS — H16403 Unspecified corneal neovascularization, bilateral: Secondary | ICD-10-CM | POA: Diagnosis not present

## 2017-06-07 ENCOUNTER — Other Ambulatory Visit: Payer: Self-pay | Admitting: Obstetrics and Gynecology

## 2017-06-07 DIAGNOSIS — Z1231 Encounter for screening mammogram for malignant neoplasm of breast: Secondary | ICD-10-CM

## 2017-06-18 DIAGNOSIS — D2239 Melanocytic nevi of other parts of face: Secondary | ICD-10-CM | POA: Diagnosis not present

## 2017-06-18 DIAGNOSIS — I350 Nonrheumatic aortic (valve) stenosis: Secondary | ICD-10-CM | POA: Diagnosis not present

## 2017-06-18 DIAGNOSIS — R748 Abnormal levels of other serum enzymes: Secondary | ICD-10-CM | POA: Diagnosis not present

## 2017-06-18 DIAGNOSIS — R7303 Prediabetes: Secondary | ICD-10-CM | POA: Diagnosis not present

## 2017-06-18 DIAGNOSIS — R7301 Impaired fasting glucose: Secondary | ICD-10-CM | POA: Diagnosis not present

## 2017-06-18 DIAGNOSIS — Q969 Turner's syndrome, unspecified: Secondary | ICD-10-CM | POA: Diagnosis not present

## 2017-06-18 DIAGNOSIS — D2262 Melanocytic nevi of left upper limb, including shoulder: Secondary | ICD-10-CM | POA: Diagnosis not present

## 2017-06-18 DIAGNOSIS — Q231 Congenital insufficiency of aortic valve: Secondary | ICD-10-CM | POA: Diagnosis not present

## 2017-06-18 DIAGNOSIS — D1801 Hemangioma of skin and subcutaneous tissue: Secondary | ICD-10-CM | POA: Diagnosis not present

## 2017-06-18 DIAGNOSIS — D2261 Melanocytic nevi of right upper limb, including shoulder: Secondary | ICD-10-CM | POA: Diagnosis not present

## 2017-06-18 DIAGNOSIS — E785 Hyperlipidemia, unspecified: Secondary | ICD-10-CM | POA: Diagnosis not present

## 2017-06-25 DIAGNOSIS — H16401 Unspecified corneal neovascularization, right eye: Secondary | ICD-10-CM | POA: Diagnosis not present

## 2017-06-25 DIAGNOSIS — Z01419 Encounter for gynecological examination (general) (routine) without abnormal findings: Secondary | ICD-10-CM | POA: Diagnosis not present

## 2017-06-25 DIAGNOSIS — H01001 Unspecified blepharitis right upper eyelid: Secondary | ICD-10-CM | POA: Diagnosis not present

## 2017-06-25 DIAGNOSIS — H01004 Unspecified blepharitis left upper eyelid: Secondary | ICD-10-CM | POA: Diagnosis not present

## 2017-07-19 ENCOUNTER — Ambulatory Visit
Admission: RE | Admit: 2017-07-19 | Discharge: 2017-07-19 | Disposition: A | Discharge status: Home / Self Care | Payer: BLUE CROSS/BLUE SHIELD | Source: Ambulatory Visit | Attending: Obstetrics and Gynecology | Admitting: Obstetrics and Gynecology

## 2017-07-19 DIAGNOSIS — Z1231 Encounter for screening mammogram for malignant neoplasm of breast: Secondary | ICD-10-CM

## 2017-12-24 DIAGNOSIS — H0100B Unspecified blepharitis left eye, upper and lower eyelids: Secondary | ICD-10-CM | POA: Diagnosis not present

## 2017-12-24 DIAGNOSIS — R7303 Prediabetes: Secondary | ICD-10-CM | POA: Diagnosis not present

## 2017-12-24 DIAGNOSIS — H0100A Unspecified blepharitis right eye, upper and lower eyelids: Secondary | ICD-10-CM | POA: Diagnosis not present

## 2017-12-24 DIAGNOSIS — H1789 Other corneal scars and opacities: Secondary | ICD-10-CM | POA: Diagnosis not present

## 2017-12-24 DIAGNOSIS — H16403 Unspecified corneal neovascularization, bilateral: Secondary | ICD-10-CM | POA: Diagnosis not present

## 2017-12-24 DIAGNOSIS — Q969 Turner's syndrome, unspecified: Secondary | ICD-10-CM | POA: Diagnosis not present

## 2017-12-24 DIAGNOSIS — Q231 Congenital insufficiency of aortic valve: Secondary | ICD-10-CM | POA: Diagnosis not present

## 2017-12-24 DIAGNOSIS — I350 Nonrheumatic aortic (valve) stenosis: Secondary | ICD-10-CM | POA: Diagnosis not present

## 2018-03-11 ENCOUNTER — Other Ambulatory Visit (HOSPITAL_COMMUNITY): Payer: Self-pay | Admitting: Internal Medicine

## 2018-03-11 DIAGNOSIS — Z0000 Encounter for general adult medical examination without abnormal findings: Secondary | ICD-10-CM | POA: Diagnosis not present

## 2018-03-11 DIAGNOSIS — Q231 Congenital insufficiency of aortic valve: Secondary | ICD-10-CM | POA: Diagnosis not present

## 2018-03-11 DIAGNOSIS — I350 Nonrheumatic aortic (valve) stenosis: Secondary | ICD-10-CM | POA: Diagnosis not present

## 2018-03-11 DIAGNOSIS — I10 Essential (primary) hypertension: Secondary | ICD-10-CM | POA: Diagnosis not present

## 2018-03-18 ENCOUNTER — Other Ambulatory Visit: Payer: Self-pay

## 2018-03-18 ENCOUNTER — Ambulatory Visit (HOSPITAL_COMMUNITY): Payer: BLUE CROSS/BLUE SHIELD | Attending: Internal Medicine

## 2018-03-18 DIAGNOSIS — I350 Nonrheumatic aortic (valve) stenosis: Secondary | ICD-10-CM | POA: Insufficient documentation

## 2018-03-18 DIAGNOSIS — Q231 Congenital insufficiency of aortic valve: Secondary | ICD-10-CM | POA: Diagnosis not present

## 2018-03-18 DIAGNOSIS — E785 Hyperlipidemia, unspecified: Secondary | ICD-10-CM | POA: Insufficient documentation

## 2018-03-18 DIAGNOSIS — I10 Essential (primary) hypertension: Secondary | ICD-10-CM | POA: Diagnosis not present

## 2018-06-10 ENCOUNTER — Other Ambulatory Visit: Payer: Self-pay | Admitting: Obstetrics and Gynecology

## 2018-06-10 DIAGNOSIS — Z1231 Encounter for screening mammogram for malignant neoplasm of breast: Secondary | ICD-10-CM

## 2018-06-24 DIAGNOSIS — Q969 Turner's syndrome, unspecified: Secondary | ICD-10-CM | POA: Diagnosis not present

## 2018-06-24 DIAGNOSIS — R7303 Prediabetes: Secondary | ICD-10-CM | POA: Diagnosis not present

## 2018-06-24 DIAGNOSIS — E785 Hyperlipidemia, unspecified: Secondary | ICD-10-CM | POA: Diagnosis not present

## 2018-07-01 DIAGNOSIS — R7303 Prediabetes: Secondary | ICD-10-CM | POA: Diagnosis not present

## 2018-07-01 DIAGNOSIS — Q969 Turner's syndrome, unspecified: Secondary | ICD-10-CM | POA: Diagnosis not present

## 2018-07-01 DIAGNOSIS — I350 Nonrheumatic aortic (valve) stenosis: Secondary | ICD-10-CM | POA: Diagnosis not present

## 2018-07-01 DIAGNOSIS — Q231 Congenital insufficiency of aortic valve: Secondary | ICD-10-CM | POA: Diagnosis not present

## 2018-07-29 DIAGNOSIS — D2262 Melanocytic nevi of left upper limb, including shoulder: Secondary | ICD-10-CM | POA: Diagnosis not present

## 2018-07-29 DIAGNOSIS — H0100A Unspecified blepharitis right eye, upper and lower eyelids: Secondary | ICD-10-CM | POA: Diagnosis not present

## 2018-07-29 DIAGNOSIS — H16403 Unspecified corneal neovascularization, bilateral: Secondary | ICD-10-CM | POA: Diagnosis not present

## 2018-07-29 DIAGNOSIS — D2261 Melanocytic nevi of right upper limb, including shoulder: Secondary | ICD-10-CM | POA: Diagnosis not present

## 2018-07-29 DIAGNOSIS — H0100B Unspecified blepharitis left eye, upper and lower eyelids: Secondary | ICD-10-CM | POA: Diagnosis not present

## 2018-08-01 ENCOUNTER — Ambulatory Visit
Admission: RE | Admit: 2018-08-01 | Discharge: 2018-08-01 | Disposition: A | Discharge status: Home / Self Care | Payer: BLUE CROSS/BLUE SHIELD | Source: Ambulatory Visit | Attending: Obstetrics and Gynecology | Admitting: Obstetrics and Gynecology

## 2018-08-01 DIAGNOSIS — Z1231 Encounter for screening mammogram for malignant neoplasm of breast: Secondary | ICD-10-CM

## 2018-09-02 DIAGNOSIS — Z01419 Encounter for gynecological examination (general) (routine) without abnormal findings: Secondary | ICD-10-CM | POA: Diagnosis not present

## 2018-11-25 DIAGNOSIS — H0100A Unspecified blepharitis right eye, upper and lower eyelids: Secondary | ICD-10-CM | POA: Diagnosis not present

## 2018-11-25 DIAGNOSIS — H0100B Unspecified blepharitis left eye, upper and lower eyelids: Secondary | ICD-10-CM | POA: Diagnosis not present

## 2018-11-25 DIAGNOSIS — H16403 Unspecified corneal neovascularization, bilateral: Secondary | ICD-10-CM | POA: Diagnosis not present

## 2018-11-30 DIAGNOSIS — H4710 Unspecified papilledema: Secondary | ICD-10-CM | POA: Diagnosis not present

## 2018-11-30 DIAGNOSIS — H16401 Unspecified corneal neovascularization, right eye: Secondary | ICD-10-CM | POA: Diagnosis not present

## 2018-11-30 DIAGNOSIS — H0100A Unspecified blepharitis right eye, upper and lower eyelids: Secondary | ICD-10-CM | POA: Diagnosis not present

## 2018-11-30 DIAGNOSIS — H5340 Unspecified visual field defects: Secondary | ICD-10-CM | POA: Diagnosis not present

## 2018-12-02 DIAGNOSIS — Z86711 Personal history of pulmonary embolism: Secondary | ICD-10-CM | POA: Diagnosis not present

## 2018-12-02 DIAGNOSIS — I10 Essential (primary) hypertension: Secondary | ICD-10-CM | POA: Diagnosis not present

## 2018-12-02 DIAGNOSIS — Z86718 Personal history of other venous thrombosis and embolism: Secondary | ICD-10-CM | POA: Diagnosis not present

## 2018-12-02 DIAGNOSIS — H53451 Other localized visual field defect, right eye: Secondary | ICD-10-CM | POA: Diagnosis not present

## 2018-12-02 DIAGNOSIS — H4710 Unspecified papilledema: Secondary | ICD-10-CM | POA: Diagnosis not present

## 2018-12-02 DIAGNOSIS — H547 Unspecified visual loss: Secondary | ICD-10-CM | POA: Diagnosis not present

## 2018-12-05 DIAGNOSIS — H4710 Unspecified papilledema: Secondary | ICD-10-CM | POA: Diagnosis not present

## 2018-12-10 DIAGNOSIS — H4710 Unspecified papilledema: Secondary | ICD-10-CM | POA: Diagnosis not present

## 2018-12-30 DIAGNOSIS — R748 Abnormal levels of other serum enzymes: Secondary | ICD-10-CM | POA: Diagnosis not present

## 2018-12-30 DIAGNOSIS — Q969 Turner's syndrome, unspecified: Secondary | ICD-10-CM | POA: Diagnosis not present

## 2018-12-30 DIAGNOSIS — R7309 Other abnormal glucose: Secondary | ICD-10-CM | POA: Diagnosis not present

## 2018-12-30 DIAGNOSIS — Q231 Congenital insufficiency of aortic valve: Secondary | ICD-10-CM | POA: Diagnosis not present

## 2018-12-30 DIAGNOSIS — I350 Nonrheumatic aortic (valve) stenosis: Secondary | ICD-10-CM | POA: Diagnosis not present

## 2018-12-30 DIAGNOSIS — R7303 Prediabetes: Secondary | ICD-10-CM | POA: Diagnosis not present

## 2019-01-02 DIAGNOSIS — H4710 Unspecified papilledema: Secondary | ICD-10-CM | POA: Diagnosis not present

## 2019-03-17 DIAGNOSIS — R0683 Snoring: Secondary | ICD-10-CM | POA: Diagnosis not present

## 2019-03-17 DIAGNOSIS — H47011 Ischemic optic neuropathy, right eye: Secondary | ICD-10-CM | POA: Diagnosis not present

## 2019-03-17 DIAGNOSIS — I10 Essential (primary) hypertension: Secondary | ICD-10-CM | POA: Diagnosis not present

## 2019-05-12 DIAGNOSIS — H5340 Unspecified visual field defects: Secondary | ICD-10-CM | POA: Diagnosis not present

## 2019-05-12 DIAGNOSIS — H47011 Ischemic optic neuropathy, right eye: Secondary | ICD-10-CM | POA: Diagnosis not present

## 2019-05-12 DIAGNOSIS — H4710 Unspecified papilledema: Secondary | ICD-10-CM | POA: Diagnosis not present

## 2019-06-09 DIAGNOSIS — H16403 Unspecified corneal neovascularization, bilateral: Secondary | ICD-10-CM | POA: Diagnosis not present

## 2019-06-09 DIAGNOSIS — H0100B Unspecified blepharitis left eye, upper and lower eyelids: Secondary | ICD-10-CM | POA: Diagnosis not present

## 2019-06-09 DIAGNOSIS — H0100A Unspecified blepharitis right eye, upper and lower eyelids: Secondary | ICD-10-CM | POA: Diagnosis not present

## 2019-06-09 DIAGNOSIS — H5340 Unspecified visual field defects: Secondary | ICD-10-CM | POA: Diagnosis not present

## 2019-06-22 ENCOUNTER — Other Ambulatory Visit: Payer: Self-pay | Admitting: Obstetrics and Gynecology

## 2019-06-22 DIAGNOSIS — Z1231 Encounter for screening mammogram for malignant neoplasm of breast: Secondary | ICD-10-CM

## 2019-07-07 DIAGNOSIS — E785 Hyperlipidemia, unspecified: Secondary | ICD-10-CM | POA: Diagnosis not present

## 2019-07-07 DIAGNOSIS — I350 Nonrheumatic aortic (valve) stenosis: Secondary | ICD-10-CM | POA: Diagnosis not present

## 2019-07-07 DIAGNOSIS — Q231 Congenital insufficiency of aortic valve: Secondary | ICD-10-CM | POA: Diagnosis not present

## 2019-07-07 DIAGNOSIS — Z23 Encounter for immunization: Secondary | ICD-10-CM | POA: Diagnosis not present

## 2019-07-07 DIAGNOSIS — R748 Abnormal levels of other serum enzymes: Secondary | ICD-10-CM | POA: Diagnosis not present

## 2019-07-07 DIAGNOSIS — R7303 Prediabetes: Secondary | ICD-10-CM | POA: Diagnosis not present

## 2019-07-07 DIAGNOSIS — Q969 Turner's syndrome, unspecified: Secondary | ICD-10-CM | POA: Diagnosis not present

## 2019-08-04 ENCOUNTER — Other Ambulatory Visit: Payer: Self-pay

## 2019-08-04 ENCOUNTER — Ambulatory Visit
Admission: RE | Admit: 2019-08-04 | Discharge: 2019-08-04 | Disposition: A | Discharge status: Home / Self Care | Payer: BLUE CROSS/BLUE SHIELD | Source: Ambulatory Visit | Attending: Obstetrics and Gynecology | Admitting: Obstetrics and Gynecology

## 2019-08-04 DIAGNOSIS — Z1231 Encounter for screening mammogram for malignant neoplasm of breast: Secondary | ICD-10-CM

## 2019-09-27 ENCOUNTER — Other Ambulatory Visit (HOSPITAL_COMMUNITY)
Admission: RE | Admit: 2019-09-27 | Discharge: 2019-09-27 | Disposition: A | Discharge status: Home / Self Care | Payer: BC Managed Care – PPO | Source: Ambulatory Visit | Attending: Obstetrics and Gynecology | Admitting: Obstetrics and Gynecology

## 2019-09-27 DIAGNOSIS — Z01419 Encounter for gynecological examination (general) (routine) without abnormal findings: Secondary | ICD-10-CM | POA: Insufficient documentation

## 2019-09-28 LAB — CYTOLOGY - PAP
Comment: NEGATIVE
Diagnosis: NEGATIVE
High risk HPV: NEGATIVE

## 2019-09-29 DIAGNOSIS — D2261 Melanocytic nevi of right upper limb, including shoulder: Secondary | ICD-10-CM | POA: Diagnosis not present

## 2019-09-29 DIAGNOSIS — L718 Other rosacea: Secondary | ICD-10-CM | POA: Diagnosis not present

## 2019-09-29 DIAGNOSIS — D485 Neoplasm of uncertain behavior of skin: Secondary | ICD-10-CM | POA: Diagnosis not present

## 2019-09-29 DIAGNOSIS — D2262 Melanocytic nevi of left upper limb, including shoulder: Secondary | ICD-10-CM | POA: Diagnosis not present

## 2019-09-29 DIAGNOSIS — D225 Melanocytic nevi of trunk: Secondary | ICD-10-CM | POA: Diagnosis not present

## 2019-10-09 DIAGNOSIS — Z0000 Encounter for general adult medical examination without abnormal findings: Secondary | ICD-10-CM | POA: Diagnosis not present

## 2019-10-09 DIAGNOSIS — I10 Essential (primary) hypertension: Secondary | ICD-10-CM | POA: Diagnosis not present

## 2019-10-09 DIAGNOSIS — R7303 Prediabetes: Secondary | ICD-10-CM | POA: Diagnosis not present

## 2019-10-09 DIAGNOSIS — I350 Nonrheumatic aortic (valve) stenosis: Secondary | ICD-10-CM | POA: Diagnosis not present

## 2019-12-01 DIAGNOSIS — H5213 Myopia, bilateral: Secondary | ICD-10-CM | POA: Diagnosis not present

## 2019-12-01 DIAGNOSIS — Z86711 Personal history of pulmonary embolism: Secondary | ICD-10-CM | POA: Diagnosis not present

## 2019-12-01 DIAGNOSIS — H47011 Ischemic optic neuropathy, right eye: Secondary | ICD-10-CM | POA: Diagnosis not present

## 2019-12-01 DIAGNOSIS — H52203 Unspecified astigmatism, bilateral: Secondary | ICD-10-CM | POA: Diagnosis not present

## 2020-01-08 DIAGNOSIS — R748 Abnormal levels of other serum enzymes: Secondary | ICD-10-CM | POA: Diagnosis not present

## 2020-01-08 DIAGNOSIS — I350 Nonrheumatic aortic (valve) stenosis: Secondary | ICD-10-CM | POA: Diagnosis not present

## 2020-01-08 DIAGNOSIS — Q969 Turner's syndrome, unspecified: Secondary | ICD-10-CM | POA: Diagnosis not present

## 2020-01-08 DIAGNOSIS — Q231 Congenital insufficiency of aortic valve: Secondary | ICD-10-CM | POA: Diagnosis not present

## 2020-01-08 DIAGNOSIS — R7303 Prediabetes: Secondary | ICD-10-CM | POA: Diagnosis not present

## 2020-01-13 DIAGNOSIS — Z23 Encounter for immunization: Secondary | ICD-10-CM | POA: Diagnosis not present

## 2020-02-02 DIAGNOSIS — Q231 Congenital insufficiency of aortic valve: Secondary | ICD-10-CM | POA: Diagnosis not present

## 2020-02-02 DIAGNOSIS — I350 Nonrheumatic aortic (valve) stenosis: Secondary | ICD-10-CM | POA: Diagnosis not present

## 2020-02-10 DIAGNOSIS — Z23 Encounter for immunization: Secondary | ICD-10-CM | POA: Diagnosis not present

## 2020-02-26 DIAGNOSIS — H5213 Myopia, bilateral: Secondary | ICD-10-CM | POA: Diagnosis not present

## 2020-02-26 DIAGNOSIS — H16403 Unspecified corneal neovascularization, bilateral: Secondary | ICD-10-CM | POA: Diagnosis not present

## 2020-02-26 DIAGNOSIS — H4720 Unspecified optic atrophy: Secondary | ICD-10-CM | POA: Diagnosis not present

## 2020-02-26 DIAGNOSIS — H0100A Unspecified blepharitis right eye, upper and lower eyelids: Secondary | ICD-10-CM | POA: Diagnosis not present

## 2020-04-12 DIAGNOSIS — I10 Essential (primary) hypertension: Secondary | ICD-10-CM | POA: Diagnosis not present

## 2020-06-27 ENCOUNTER — Other Ambulatory Visit: Payer: Self-pay | Admitting: Obstetrics and Gynecology

## 2020-06-27 DIAGNOSIS — Z1231 Encounter for screening mammogram for malignant neoplasm of breast: Secondary | ICD-10-CM

## 2020-07-05 DIAGNOSIS — H1789 Other corneal scars and opacities: Secondary | ICD-10-CM | POA: Diagnosis not present

## 2020-07-05 DIAGNOSIS — H16403 Unspecified corneal neovascularization, bilateral: Secondary | ICD-10-CM | POA: Diagnosis not present

## 2020-07-05 DIAGNOSIS — H4720 Unspecified optic atrophy: Secondary | ICD-10-CM | POA: Diagnosis not present

## 2020-07-05 DIAGNOSIS — H0100A Unspecified blepharitis right eye, upper and lower eyelids: Secondary | ICD-10-CM | POA: Diagnosis not present

## 2020-07-12 DIAGNOSIS — Q969 Turner's syndrome, unspecified: Secondary | ICD-10-CM | POA: Diagnosis not present

## 2020-07-12 DIAGNOSIS — R7303 Prediabetes: Secondary | ICD-10-CM | POA: Diagnosis not present

## 2020-07-12 DIAGNOSIS — Q231 Congenital insufficiency of aortic valve: Secondary | ICD-10-CM | POA: Diagnosis not present

## 2020-07-12 DIAGNOSIS — I350 Nonrheumatic aortic (valve) stenosis: Secondary | ICD-10-CM | POA: Diagnosis not present

## 2020-08-02 ENCOUNTER — Other Ambulatory Visit: Payer: Self-pay

## 2020-08-02 ENCOUNTER — Ambulatory Visit
Admission: RE | Admit: 2020-08-02 | Discharge: 2020-08-02 | Disposition: A | Discharge status: Home / Self Care | Payer: BC Managed Care – PPO | Source: Ambulatory Visit | Attending: Obstetrics and Gynecology | Admitting: Obstetrics and Gynecology

## 2020-08-02 DIAGNOSIS — Z1231 Encounter for screening mammogram for malignant neoplasm of breast: Secondary | ICD-10-CM

## 2020-09-27 DIAGNOSIS — Z23 Encounter for immunization: Secondary | ICD-10-CM | POA: Diagnosis not present

## 2020-09-27 DIAGNOSIS — Z01419 Encounter for gynecological examination (general) (routine) without abnormal findings: Secondary | ICD-10-CM | POA: Diagnosis not present

## 2020-10-11 DIAGNOSIS — I350 Nonrheumatic aortic (valve) stenosis: Secondary | ICD-10-CM | POA: Diagnosis not present

## 2020-10-11 DIAGNOSIS — E785 Hyperlipidemia, unspecified: Secondary | ICD-10-CM | POA: Diagnosis not present

## 2020-10-11 DIAGNOSIS — Z0000 Encounter for general adult medical examination without abnormal findings: Secondary | ICD-10-CM | POA: Diagnosis not present

## 2020-10-11 DIAGNOSIS — I10 Essential (primary) hypertension: Secondary | ICD-10-CM | POA: Diagnosis not present

## 2020-10-11 DIAGNOSIS — Q231 Congenital insufficiency of aortic valve: Secondary | ICD-10-CM | POA: Diagnosis not present

## 2020-11-08 DIAGNOSIS — Q969 Turner's syndrome, unspecified: Secondary | ICD-10-CM | POA: Diagnosis not present

## 2020-11-08 DIAGNOSIS — K589 Irritable bowel syndrome without diarrhea: Secondary | ICD-10-CM | POA: Diagnosis not present

## 2020-11-08 DIAGNOSIS — Z1211 Encounter for screening for malignant neoplasm of colon: Secondary | ICD-10-CM | POA: Diagnosis not present

## 2020-11-08 DIAGNOSIS — K8020 Calculus of gallbladder without cholecystitis without obstruction: Secondary | ICD-10-CM | POA: Diagnosis not present

## 2020-12-19 DIAGNOSIS — H0100A Unspecified blepharitis right eye, upper and lower eyelids: Secondary | ICD-10-CM | POA: Diagnosis not present

## 2020-12-19 DIAGNOSIS — H0100B Unspecified blepharitis left eye, upper and lower eyelids: Secondary | ICD-10-CM | POA: Diagnosis not present

## 2020-12-19 DIAGNOSIS — H16403 Unspecified corneal neovascularization, bilateral: Secondary | ICD-10-CM | POA: Diagnosis not present

## 2020-12-20 DIAGNOSIS — L718 Other rosacea: Secondary | ICD-10-CM | POA: Diagnosis not present

## 2020-12-20 DIAGNOSIS — D2261 Melanocytic nevi of right upper limb, including shoulder: Secondary | ICD-10-CM | POA: Diagnosis not present

## 2020-12-20 DIAGNOSIS — D224 Melanocytic nevi of scalp and neck: Secondary | ICD-10-CM | POA: Diagnosis not present

## 2020-12-20 DIAGNOSIS — D2262 Melanocytic nevi of left upper limb, including shoulder: Secondary | ICD-10-CM | POA: Diagnosis not present

## 2021-01-17 DIAGNOSIS — R748 Abnormal levels of other serum enzymes: Secondary | ICD-10-CM | POA: Diagnosis not present

## 2021-01-17 DIAGNOSIS — Q969 Turner's syndrome, unspecified: Secondary | ICD-10-CM | POA: Diagnosis not present

## 2021-01-17 DIAGNOSIS — R7303 Prediabetes: Secondary | ICD-10-CM | POA: Diagnosis not present

## 2021-01-17 DIAGNOSIS — E785 Hyperlipidemia, unspecified: Secondary | ICD-10-CM | POA: Diagnosis not present

## 2021-01-17 DIAGNOSIS — I350 Nonrheumatic aortic (valve) stenosis: Secondary | ICD-10-CM | POA: Diagnosis not present

## 2021-01-17 DIAGNOSIS — Q231 Congenital insufficiency of aortic valve: Secondary | ICD-10-CM | POA: Diagnosis not present

## 2021-01-31 DIAGNOSIS — Z1211 Encounter for screening for malignant neoplasm of colon: Secondary | ICD-10-CM | POA: Diagnosis not present

## 2021-01-31 DIAGNOSIS — K635 Polyp of colon: Secondary | ICD-10-CM | POA: Diagnosis not present

## 2021-01-31 DIAGNOSIS — K648 Other hemorrhoids: Secondary | ICD-10-CM | POA: Diagnosis not present

## 2021-02-03 DIAGNOSIS — H903 Sensorineural hearing loss, bilateral: Secondary | ICD-10-CM | POA: Diagnosis not present

## 2021-06-23 ENCOUNTER — Other Ambulatory Visit: Payer: Self-pay | Admitting: Obstetrics and Gynecology

## 2021-06-23 DIAGNOSIS — Z1231 Encounter for screening mammogram for malignant neoplasm of breast: Secondary | ICD-10-CM

## 2021-08-01 DIAGNOSIS — H0100A Unspecified blepharitis right eye, upper and lower eyelids: Secondary | ICD-10-CM | POA: Diagnosis not present

## 2021-08-01 DIAGNOSIS — I350 Nonrheumatic aortic (valve) stenosis: Secondary | ICD-10-CM | POA: Diagnosis not present

## 2021-08-01 DIAGNOSIS — Z23 Encounter for immunization: Secondary | ICD-10-CM | POA: Diagnosis not present

## 2021-08-01 DIAGNOSIS — E785 Hyperlipidemia, unspecified: Secondary | ICD-10-CM | POA: Diagnosis not present

## 2021-08-01 DIAGNOSIS — H0100B Unspecified blepharitis left eye, upper and lower eyelids: Secondary | ICD-10-CM | POA: Diagnosis not present

## 2021-08-01 DIAGNOSIS — R748 Abnormal levels of other serum enzymes: Secondary | ICD-10-CM | POA: Diagnosis not present

## 2021-08-01 DIAGNOSIS — R7303 Prediabetes: Secondary | ICD-10-CM | POA: Diagnosis not present

## 2021-08-01 DIAGNOSIS — Q231 Congenital insufficiency of aortic valve: Secondary | ICD-10-CM | POA: Diagnosis not present

## 2021-08-01 DIAGNOSIS — Q969 Turner's syndrome, unspecified: Secondary | ICD-10-CM | POA: Diagnosis not present

## 2021-08-01 DIAGNOSIS — L719 Rosacea, unspecified: Secondary | ICD-10-CM | POA: Diagnosis not present

## 2021-08-01 DIAGNOSIS — I10 Essential (primary) hypertension: Secondary | ICD-10-CM | POA: Diagnosis not present

## 2021-08-04 ENCOUNTER — Other Ambulatory Visit: Payer: Self-pay

## 2021-08-04 ENCOUNTER — Ambulatory Visit
Admission: RE | Admit: 2021-08-04 | Discharge: 2021-08-04 | Disposition: A | Discharge status: Home / Self Care | Payer: BC Managed Care – PPO | Source: Ambulatory Visit | Attending: Obstetrics and Gynecology | Admitting: Obstetrics and Gynecology

## 2021-08-04 DIAGNOSIS — Z1231 Encounter for screening mammogram for malignant neoplasm of breast: Secondary | ICD-10-CM

## 2021-08-08 DIAGNOSIS — R7303 Prediabetes: Secondary | ICD-10-CM | POA: Diagnosis not present

## 2021-08-18 DIAGNOSIS — I350 Nonrheumatic aortic (valve) stenosis: Secondary | ICD-10-CM | POA: Diagnosis not present

## 2021-08-18 DIAGNOSIS — E119 Type 2 diabetes mellitus without complications: Secondary | ICD-10-CM | POA: Diagnosis not present

## 2021-08-18 DIAGNOSIS — Q969 Turner's syndrome, unspecified: Secondary | ICD-10-CM | POA: Diagnosis not present

## 2021-08-18 DIAGNOSIS — Q231 Congenital insufficiency of aortic valve: Secondary | ICD-10-CM | POA: Diagnosis not present

## 2021-10-03 DIAGNOSIS — Z01419 Encounter for gynecological examination (general) (routine) without abnormal findings: Secondary | ICD-10-CM | POA: Diagnosis not present

## 2021-10-24 ENCOUNTER — Other Ambulatory Visit: Payer: Self-pay

## 2021-10-24 ENCOUNTER — Encounter: Payer: Self-pay | Admitting: Dietician

## 2021-10-24 ENCOUNTER — Encounter: Payer: BC Managed Care – PPO | Attending: Internal Medicine | Admitting: Dietician

## 2021-10-24 DIAGNOSIS — E119 Type 2 diabetes mellitus without complications: Secondary | ICD-10-CM | POA: Diagnosis not present

## 2021-10-24 NOTE — Patient Instructions (Signed)
Plan:  Aim for 2-3 Carb Choices per meal (30-45 grams) +/- 1 either way  Aim for 0-1 Carbs per snack if hungry  Include protein in moderation with your meals and snacks Consider reading food labels for Total Carbohydrate of foods Consider  increasing your activity level by walking for 30 minutes daily as tolerated - Consider starting work earlier and take a break to walk during the day. Consider checking BG at alternate times per day  Continue taking medication as directed by MD    Eat more Non-Starchy Vegetables These include greens, broccoli, cauliflower, cabbage, carrots, beets, eggplant, peppers, squash and others. Minimize added sugars and refined grains Rethink what you drink.  Choose beverages without added sugar.  Look for 0 carbs on the label. See the list of whole grains below.  Find alternatives to usual sweet treats. Choose whole foods over processed. Make simple meals at home more often than eating out.  Tips to increase fiber in your diet: (All plants have fiber.  Eat a variety. There are more than are on this list.) Slowly increase the amount of fiber you eat to 25-35 grams per day.  (More is fine if you tolerate it.) Fiber from whole grains, nuts and seeds Quinoa, 1/2 cup = 5 grams Bulgur, 1/2 cup = 4.1 grams Popcorn, 3 cups = 3.6 grams Whole Wheat Spaghetti, 1/2 cup = 3.2 grams Barley, 1/2 cup = 3 grams Oatmeal, 1/2 cup = 2 grams Whole Wheat English Muffin = 3 grams Corn, 1/2 cup = 2.1 grams Brown Rice, 1/2 cup = 1.8 grams Flax seeds, 1 Tablespoon = 2.8 grams Chia seeds, 1 Tablespoon = 11 grams Almonds, 1 ounce = 3.5 grams fiber Fiber from legumes Kidney beans, 1/2 cup 7.9 grams Lentils, 1/2 cup = 7.8 grams Pinto beans, 1/2 cup = 7.7 grams Black beans, 1/2 cup = 7.6 grams Lima beans, 1/2 cup 6.4 grams Chick peas, 1/2 cup = 5.3 grams Black eyed peas, 1/2 cup = 4 grams Fiber from fruits and vegetables Pear, 6 grams Apple. 3.3 grams Raspberries or  Blackberries, 3/4 cup = 6 grams Strawberries or Blueberries, 1 cup = 3.4 grams Baked sweet potato 3.8 grams fiber Baked potato with skin 4.4 grams  Peas, 1/2 cup = 4.4 grams  Spinach, 1/2 cup cooked = 3.5 grams  Avocado, 1/2 = 5 grams

## 2021-10-24 NOTE — Progress Notes (Signed)
Diabetes Self-Management Education  Visit Type: First/Initial  Appt. Start Time: 0910 Appt. End Time: 1025  10/24/2021  [name], identified by name and date of birth, is a 45 y.o. female with a diagnosis of Diabetes: Type 2.   ASSESSMENT Patient is here today alone.  She was referred for type 2 diabetes - newly diagnosed.  She states that she needs to exercise more and does eat fairly well.  Food "crutch" is chocolate.  She states that she was doing well earlier this year but had cake on her birthday with started more consistent sweets.  She keeps to the portion and does not binge.  History includes Type 2 Diabetes (2022), psoriasis, turners syndrome, HTN, PE, rosacea, history of hair loss, mild IBS, mild aortic stenosis, bicuspid aortic valve Medications include:  Metformin A1C 6.5% 07/2021  Weight hx: 60.5" 210 lbs 10/24/2021 -  vstable for the past 10 years "Always overweight since a child" 225 lbs highest adult weight about 1999  Patient lives with her parents.  Both parents also have diabetes.  Her father and her do the shopping and cooking.  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 Her mother had a stroke last year and has become extremely picky.    Height 5' 0.5" (1.537 m), weight 210 lb (95.3 kg). Body mass index is 40.34 kg/m.   Diabetes Self-Management Education - 10/24/21 0935       Visit Information   Visit Type First/Initial      Initial Visit   Diabetes Type Type 2    Are you currently following a meal plan? Yes    What type of meal plan do you follow? diabetic    Are you taking your medications as prescribed? Yes    Date Diagnosed 07/2021      Health Coping   How would you rate your overall health? Fair      Psychosocial Assessment   Patient Belief/Attitude about Diabetes Other (comment)   to be detetermined   Self-care barriers None    Self-management support Doctor's office;Family    Other  persons present Patient    Patient Concerns Nutrition/Meal planning;Glycemic Control;Weight Control;Healthy Lifestyle    Special Needs None    Preferred Learning Style No preference indicated    Learning Readiness Ready    How often do you need to have someone help you when you read instructions, pamphlets, or other written materials from your doctor or pharmacy? 1 - Never    What is the last grade level you completed in school? JD      Pre-Education Assessment   Patient understands the diabetes disease and treatment process. Needs Instruction    Patient understands incorporating nutritional management into lifestyle. Needs Instruction    Patient undertands incorporating physical activity into lifestyle. Needs Instruction    Patient understands using medications safely. Needs Instruction    Patient understands monitoring blood glucose, interpreting and using results Needs Instruction    Patient understands prevention, detection, and treatment of acute complications. Needs Instruction    Patient understands prevention, detection, and treatment of chronic complications. Needs Instruction    Patient understands how to develop strategies to address psychosocial issues. Needs Instruction    Patient understands how to develop strategies to promote health/change behavior. Needs Instruction      Complications   Last HgB A1C per patient/outside source 6.5 %   07/2021   How often do you check your blood sugar?  Not recommended by provider    Have you had a dilated eye exam in the past 12 months? Yes    Have you had a dental exam in the past 12 months? Yes    Are you checking your feet? No      Dietary Intake   Breakfast Neeses sausage on white or wheat bread, OJ    Snack (morning) occasional if bored - nutrigrain bar    Lunch [employer] grilled chicken sandwich OR homemade hamburger or deli sandwich OR sub shop WITH occasional fries or chips or side salad or mac and cheese    Snack (afternoon)  occasional fruit    Dinner steak and starch (baked sweet potato or white potato) and vegetables    Snack (evening) occasional chocolate    Beverage(s) water, OJ, occasional 2% milk      Exercise   Exercise Type ADL's      Patient Education   Previous Diabetes Education No    Disease state  Definition of diabetes, type 1 and 2, and the diagnosis of diabetes    Nutrition management  Role of diet in the treatment of diabetes and the relationship between the three main macronutrients and blood glucose level;Meal options for control of blood glucose level and chronic complications.;Food label reading, portion sizes and measuring food.    Physical activity and exercise  Role of exercise on diabetes management, blood pressure control and cardiac health.    Medications Reviewed patients medication for diabetes, action, purpose, timing of dose and side effects.    Monitoring Identified appropriate SMBG and/or A1C goals.;Daily foot exams;Yearly dilated eye exam    Acute complications Taught treatment of hypoglycemia - the 15 rule.    Chronic complications Relationship between chronic complications and blood glucose control    Psychosocial adjustment Identified and addressed patients feelings and concerns about diabetes;Role of stress on diabetes      Individualized Goals (developed by patient)   Nutrition Follow meal plan discussed    Physical Activity Exercise 3-5 times per week;30 minutes per day    Medications take my medication as prescribed    Reducing Risk increase portions of healthy fats      Post-Education Assessment   Patient understands the diabetes disease and treatment process. Demonstrates understanding / competency    Patient understands incorporating nutritional management into lifestyle. Demonstrates understanding / competency    Patient undertands incorporating physical activity into lifestyle. Demonstrates understanding / competency    Patient understands using medications  safely. Demonstrates understanding / competency    Patient understands monitoring blood glucose, interpreting and using results Demonstrates understanding / competency    Patient understands prevention, detection, and treatment of acute complications. Demonstrates understanding / competency    Patient understands prevention, detection, and treatment of chronic complications. Demonstrates understanding / competency    Patient understands how to develop strategies to address psychosocial issues. Demonstrates understanding / competency    Patient understands how to develop strategies to promote health/change behavior. Needs Review      Outcomes   Expected Outcomes Demonstrated interest in learning. Expect positive outcomes    Future DMSE PRN    Program Status Completed             Individualized Plan for Diabetes Self-Management Training:   Learning Objective:  Patient will have a greater understanding of diabetes self-management. Patient education plan is to attend individual and/or group sessions per assessed needs and concerns.   Plan:   Patient Instructions  Plan:  Aim for 2-3 Carb Choices per meal (30-45 grams) +/- 1 either way  Aim for 0-1 Carbs per snack if hungry  Include protein in moderation with your meals and snacks Consider reading food labels for Total Carbohydrate of foods Consider  increasing your activity level by walking for 30 minutes daily as tolerated - Consider starting work earlier and take a break to walk during the day. Consider checking BG at alternate times per day  Continue taking medication as directed by MD    Eat more Non-Starchy Vegetables These include greens, broccoli, cauliflower, cabbage, carrots, beets, eggplant, peppers, squash and others. Minimize added sugars and refined grains Rethink what you drink.  Choose beverages without added sugar.  Look for 0 carbs on the label. See the list of whole grains below.  Find alternatives to usual sweet  treats. Choose whole foods over processed. Make simple meals at home more often than eating out.  Tips to increase fiber in your diet: (All plants have fiber.  Eat a variety. There are more than are on this list.) Slowly increase the amount of fiber you eat to 25-35 grams per day.  (More is fine if you tolerate it.) Fiber from whole grains, nuts and seeds Quinoa, 1/2 cup = 5 grams Bulgur, 1/2 cup = 4.1 grams Popcorn, 3 cups = 3.6 grams Whole Wheat Spaghetti, 1/2 cup = 3.2 grams Barley, 1/2 cup = 3 grams Oatmeal, 1/2 cup = 2 grams Whole Wheat English Muffin = 3 grams Corn, 1/2 cup = 2.1 grams Brown Rice, 1/2 cup = 1.8 grams Flax seeds, 1 Tablespoon = 2.8 grams Chia seeds, 1 Tablespoon = 11 grams Almonds, 1 ounce = 3.5 grams fiber Fiber from legumes Kidney beans, 1/2 cup 7.9 grams Lentils, 1/2 cup = 7.8 grams Pinto beans, 1/2 cup = 7.7 grams Black beans, 1/2 cup = 7.6 grams Lima beans, 1/2 cup 6.4 grams Chick peas, 1/2 cup = 5.3 grams Black eyed peas, 1/2 cup = 4 grams Fiber from fruits and vegetables Pear, 6 grams Apple. 3.3 grams Raspberries or Blackberries, 3/4 cup = 6 grams Strawberries or Blueberries, 1 cup = 3.4 grams Baked sweet potato 3.8 grams fiber Baked potato with skin 4.4 grams  Peas, 1/2 cup = 4.4 grams  Spinach, 1/2 cup cooked = 3.5 grams  Avocado, 1/2 = 5 grams        Expected Outcomes:  Demonstrated interest in learning. Expect positive outcomes  Education material provided: ADA - How to Thrive: A Guide for Your Journey with Diabetes, Food label handouts, Meal plan card, Snack sheet, and Diabetes Resources  If problems or questions, patient to contact team via:  Phone  Future DSME appointment: PRN

## 2021-12-05 DIAGNOSIS — L821 Other seborrheic keratosis: Secondary | ICD-10-CM | POA: Diagnosis not present

## 2021-12-05 DIAGNOSIS — D2261 Melanocytic nevi of right upper limb, including shoulder: Secondary | ICD-10-CM | POA: Diagnosis not present

## 2021-12-05 DIAGNOSIS — D2239 Melanocytic nevi of other parts of face: Secondary | ICD-10-CM | POA: Diagnosis not present

## 2021-12-05 DIAGNOSIS — D2262 Melanocytic nevi of left upper limb, including shoulder: Secondary | ICD-10-CM | POA: Diagnosis not present

## 2022-01-02 DIAGNOSIS — E119 Type 2 diabetes mellitus without complications: Secondary | ICD-10-CM | POA: Diagnosis not present

## 2022-01-02 DIAGNOSIS — Q231 Congenital insufficiency of aortic valve: Secondary | ICD-10-CM | POA: Diagnosis not present

## 2022-01-02 DIAGNOSIS — I10 Essential (primary) hypertension: Secondary | ICD-10-CM | POA: Diagnosis not present

## 2022-01-02 DIAGNOSIS — K760 Fatty (change of) liver, not elsewhere classified: Secondary | ICD-10-CM | POA: Diagnosis not present

## 2022-01-02 DIAGNOSIS — I350 Nonrheumatic aortic (valve) stenosis: Secondary | ICD-10-CM | POA: Diagnosis not present

## 2022-01-02 DIAGNOSIS — E7800 Pure hypercholesterolemia, unspecified: Secondary | ICD-10-CM | POA: Diagnosis not present

## 2022-01-02 DIAGNOSIS — Z0000 Encounter for general adult medical examination without abnormal findings: Secondary | ICD-10-CM | POA: Diagnosis not present

## 2022-01-14 ENCOUNTER — Telehealth: Payer: Self-pay

## 2022-01-14 NOTE — Telephone Encounter (Signed)
NOTES SCANNED TO REFERRAL 

## 2022-01-21 ENCOUNTER — Other Ambulatory Visit (HOSPITAL_COMMUNITY): Payer: Self-pay | Admitting: Internal Medicine

## 2022-01-21 DIAGNOSIS — Q231 Congenital insufficiency of aortic valve: Secondary | ICD-10-CM

## 2022-01-22 ENCOUNTER — Ambulatory Visit: Payer: BC Managed Care – PPO | Admitting: Internal Medicine

## 2022-01-22 ENCOUNTER — Encounter: Payer: Self-pay | Admitting: Internal Medicine

## 2022-01-22 VITALS — BP 126/84 | HR 78 | Ht 60.5 in | Wt 206.8 lb

## 2022-01-22 DIAGNOSIS — Q249 Congenital malformation of heart, unspecified: Secondary | ICD-10-CM

## 2022-01-22 DIAGNOSIS — Q231 Congenital insufficiency of aortic valve: Secondary | ICD-10-CM | POA: Diagnosis not present

## 2022-01-22 LAB — BASIC METABOLIC PANEL
BUN/Creatinine Ratio: 22 (ref 9–23)
BUN: 14 mg/dL (ref 6–24)
CO2: 23 mmol/L (ref 20–29)
Calcium: 9.5 mg/dL (ref 8.7–10.2)
Chloride: 104 mmol/L (ref 96–106)
Creatinine, Ser: 0.65 mg/dL (ref 0.57–1.00)
Glucose: 127 mg/dL — ABNORMAL HIGH (ref 70–99)
Potassium: 4.2 mmol/L (ref 3.5–5.2)
Sodium: 139 mmol/L (ref 134–144)
eGFR: 109 mL/min/{1.73_m2} (ref >59)

## 2022-01-22 MED ORDER — METOPROLOL TARTRATE 100 MG PO TABS: 100.0000 mg | ORAL_TABLET | Freq: Once | ORAL | 0 refills | Status: DC

## 2022-01-22 NOTE — Patient Instructions (Signed)
Medication Instructions:  ?PLEASE TAKE METOPROLOL TARTRATE '100mg'$  TWO HOURS PRIOR TO CCTA  ?*If you need a refill on your cardiac medications before your next appointment, please call your pharmacy* ? ?Lab Work: ?BLOOD WORK TODAY  ?If you have labs (blood work) drawn today and your tests are completely normal, you will receive your results only by: ?MyChart Message (if you have MyChart) OR ?A paper copy in the mail ?If you have any lab test that is abnormal or we need to change your treatment, we will call you to review the results. ? ?Testing/Procedures: ?Your physician has requested that you have cardiac CT. Cardiac computed tomography (CT) is a painless test that uses an x-ray machine to take clear, detailed pictures of your heart. For further information please visit HugeFiesta.[email or web address]. Please follow instruction sheet as given. ? ?Follow-Up: ?At [facility], you and your health needs are our priority.  As part of our continuing mission to provide you with exceptional heart care, we have created designated Provider Care Teams.  These Care Teams include your primary Cardiologist (physician) and Advanced Practice Providers (APPs -  Physician Assistants and Nurse Practitioners) who all work together to provide you with the care you need, when you need it. ? ?Your next appointment:   ?6 month(s) ? ?The format for your next appointment:   ?In Person ? ?Provider:   ?Janina Mayo, MD   ? ?Other Instructions ? ? ?Your cardiac CT will be scheduled at one of the below locations:  ? ?[facility] ?[address] ?[place], [place] [address] ?[phone] ? ?If scheduled at [facility], please arrive at the [facility] and Children's Entrance (Entrance C2) of [facility] 30 minutes prior to test start time. ?You can use the FREE valet parking offered at entrance C (encouraged to control the heart rate for the test)  ?Proceed to the [facility] Radiology Department (first floor) to check-in and  test prep. ? ?All radiology patients and guests should use entrance C2 at [facility], accessed from [facility], even though the hospital's physical address listed is [address]. ? ? ? ? ?Please follow these instructions carefully (unless otherwise directed): ? ?On the Night Before the Test: ?Be sure to Drink plenty of water. ?Do not consume any caffeinated/decaffeinated beverages or chocolate 12 hours prior to your test. ?Do not take any antihistamines 12 hours prior to your test. ? ?On the Day of the Test: ?Drink plenty of water until 1 hour prior to the test. ?Do not eat any food 4 hours prior to the test. ?You may take your regular medications prior to the test.  ?Take metoprolol (Lopressor) two hours prior to test. ?HOLD Furosemide/Hydrochlorothiazide morning of the test. ?FEMALES- please wear underwire-free bra if available, avoid dresses & tight clothing ? ?After the Test: ?Drink plenty of water. ?After receiving IV contrast, you may experience a mild flushed feeling. This is normal. ?On occasion, you may experience a mild rash up to 24 hours after the test. This is not dangerous. If this occurs, you can take Benadryl 25 mg and increase your fluid intake. ?If you experience trouble breathing, this can be serious. If it is severe call 911 IMMEDIATELY. If it is mild, please call our office. ?If you take any of these medications: Glipizide/Metformin, Avandament, Glucavance, please do not take 48 hours after completing test unless otherwise instructed. ? ?We will call to schedule your test 2-4 weeks out understanding that some insurance companies  will need an authorization prior to the service being performed.  ? ?For non-scheduling related questions, please contact the cardiac imaging nurse navigator should you have any questions/concerns: ?Marchia Bond, Cardiac Imaging Nurse Navigator ?Gordy Clement, Cardiac Imaging Nurse Navigator ?[facility] ?Direct Office Dial: [phone]  ? ?For scheduling needs, including cancellations and rescheduling, please call [place], [phone]. ?  ?

## 2022-01-22 NOTE — Progress Notes (Signed)
?Cardiology Office Note:   ? ?Date:  01/22/2022  ? ?ID:  Tiffany Cortez, DOB 05-Jun-1976, MRN [id number] ? ?PCP:  Lavone Orn, MD ?  ?[facility] Prov[facility] ?Cardiologist:  Janina Mayo, MD    ? ?Referring MD: Lavone Orn, MD  ? ?No chief complaint on file. ?Bicuspid Aortic Valve ? ?History of Present Illness:   ? ?Tiffany Cortez is a 46 y.o. female with a hx of DM (A1c 6.2%) on metformin, HTN, MGUS, PE, referral for bicuspid aortic valve ? ?She notes she has Turner's syndrome. She gets serial imaging. She's had no imaging of her aorta. She has an echo pending. No arrhythmia. She's not had any heart catheterizations or stress tests.  She had a PE in 2015; she was on xarelto. She took it for a few months. She was on birth control at that time.  ? ?Family Hx: no family hx of congenital heart disease. Paternal GF had an MI in the 44s. MGM had DVT.  ? ?Social Hx: no smoking. She's working. 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 ?Normal TSH ? ?Cardiology Studies: ?TTE - EF normal, Normal RV function. moderate aortic stenosis, calcified raphe, mean gradient 22 mmHg. Ascending aorta 39 mm ? ?Past Medical History:  ?Diagnosis Date  ? Aortic insufficiency   ? Aortic stenosis   ? Atypical nevi   ? Bicuspid doming of aortic cusp   ? Diabetes mellitus without complication ([place])   ? Hypertension   ? IBS (irritable bowel syndrome)   ? MILD  ? Left ventricular diastolic dysfunction   ? stage 1  ? 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 Mild dilation of ascending aorta ([facility])   ? Obesity   ? Pulmonary embolism ([place])   ? Pulmonary hyperinflation   ? Recurrent otitis media   ? Rosacea   ? Turner syndrome   ? Vulvar dystrophy   ? ? ?No past surgical history on file. ? ?Current Medications: ?Current Meds  ?Medication Sig  ? amoxicillin (AMOXIL) 500 MG capsule TAKE 4 CAPSULES BY MOUTH 1 HOUR BEFORE DENTAL APPOINTMENT  ? Azelaic Acid 15  % gel   ? cholecalciferol (VITAMIN D) 1000 UNITS tablet   ? doxycycline (VIBRAMYCIN) 100 MG capsule   ? lisinopril (PRINIVIL,ZESTRIL) 20 MG tablet 1 tablet  ? metFORMIN (GLUCOPHAGE) 500 MG tablet Take 500 mg by mouth 2 (two) times daily with a meal.  ? metoprolol tartrate (LOPRESSOR) 100 MG tablet Take 1 tablet (100 mg total) by mouth once for 1 dose. PLEASE TAKE METOPROLOL 2  HOURS PRIOR TO CTA SCAN.  ? multivitamin-iron-minerals-folic acid (CENTRUM) chewable tablet Chew 1 tablet by mouth daily.  ? rosuvastatin (CRESTOR) 5 MG tablet 1 tablet  ?  ? ?Allergies:   Patient has no known allergies.  ? ?Social History  ? ?Socioeconomic History  ? Marital status: Single  ?  Spouse name: Not on file  ? Number of children: Not on file  ? Years of education: Not on file  ? Highest education level: Not on file  ?Occupational History  ? Not on file  ?Tobacco Use  ? Smoking status: Never  ? Smokeless tobacco: Not on file  ?Substance and Sexual Activity  ? Alcohol use: Not on file  ? Drug use: Not on file  ? Sexual activity: Not on file  ?Other Topics Concern  ? Not on file  ?Social History Narrative  ?  Not on file  ? ?Social Determinants of Health  ? ?Financial Resource Strain: Not on file  ?Food Insecurity: Not on file  ?Transportation Needs: Not on file  ?Physical Activity: Not on file  ?Stress: Not on file  ?Social Connections: Not on file  ?  ? ?Family History: ?The patient's family history includes Colon cancer in her maternal aunt; Deep vein thrombosis in her maternal grandmother; Diabetes in her father; Hypertension in her father and mother; Obesity in her father. There is no history of Breast cancer. ? ?ROS:   ?Please see the history of present illness.    ? All other systems reviewed and are negative. ? ?EKGs/Labs/Other Studies Reviewed:   ? ?The following studies were reviewed today: ? ? ?EKG:  EKG is  ordered today.  The ekg ordered today demonstrates  ?EKG 01/22/2022- NSR  ? ? ?Recent Labs: ?No results found for  requested labs within last 8760 hours.  ?Recent Lipid Panel ?No results found for: CHOL, TRIG, HDL, CHOLHDL, VLDL, LDLCALC, LDLDIRECT ? ? ?Risk Assessment/Calculations:   ?  ?ASCVD 2.3% ?    ? ?Physical Exam:   ? ?VS:  ? ?Vitals:  ? 01/22/22 0906  ?BP: 126/84  ?Pulse: 78  ?SpO2: 99%  ? ? ? ? BP 126/84   Pulse 78   Ht 5' 0.5" (1.537 m)   Wt 206 lb 12.8 oz (93.8 kg)   SpO2 99%   BMI 39.72 kg/m?    ? ?Wt Readings from Last 3 Encounters:  ?01/22/22 206 lb 12.8 oz (93.8 kg)  ?10/24/21 210 lb (95.3 kg)  ?12/28/16 223 lb (101.2 kg)  ?  ? ?GEN:  Well nourished, well developed in no acute distress ?HEENT: Normal ?NECK: No JVD; No carotid bruits ?LYMPHATICS: No lymphadenopathy ?CARDIAC: RRR, no murmurs, rubs, gallops ?RESPIRATORY:  Clear to auscultation without rales, wheezing or rhonchi  ?ABDOMEN: Soft, non-tender, non-distended ?MUSCULOSKELETAL:  No edema; No deformity  ?SKIN: Warm and dry ?NEUROLOGIC:  Alert and oriented x 3 ?PSYCHIATRIC:  Normal affect  ? ?ASSESSMENT:   ? ?#Bicuspid Aortic Valve: Patient has hx of Turner's syndrome. She has hx of BAV. Noted to have moderate AS mean gradient of 22 mmHg in 2019. She has calcification along the raphe. Will plan for cardiac CT to assess the thoracic aorta for aortopathy. Will plan for screening echocardiogram q 1 -2 years. Next echo due 2025 unless stenosis has progressed.  ? ?#HTN- Well controlled. continue lisinopril 20 mg daily. Will be important to keep BP at goal < 130/80 mmHg with risk of aortopathy ? ? ? ? ?PLAN:   ? ?In order of problems listed above: ? ?Cardiac CTA ?TTE pending ?Follow 6 months ? ?   ? ?   ?Medication Adjustments/Labs and Tests Ordered: ?Current medicines are reviewed at length with the patient today.  Concerns regarding medicines are outlined above.  ?Orders Placed This Encounter  ?Procedures  ? CT CORONARY MORPH W/CTA COR W/SCORE W/CA W/CM &/OR WO/CM  ? Basic metabolic panel  ? EKG 12-Lead  ? ?Meds ordered this encounter  ?Medications  ?  metoprolol tartrate (LOPRESSOR) 100 MG tablet  ?  Sig: Take 1 tablet (100 mg total) by mouth once for 1 dose. PLEASE TAKE METOPROLOL 2  HOURS PRIOR TO CTA SCAN.  ?  Dispense:  1 tablet  ?  Refill:  0  ? ? ?Patient Instructions  ?Medication Instructions:  ?PLEASE TAKE METOPROLOL TARTRATE '100mg'$  TWO HOURS PRIOR TO CCTA  ?*If you need a refill  on your cardiac medications before your next appointment, please call your pharmacy* ? ?Lab Work: ?BLOOD WORK TODAY  ?If you have labs (blood work) drawn today and your tests are completely normal, you will receive your results only by: ?MyChart Message (if you have MyChart) OR ?A paper copy in the mail ?If you have any lab test that is abnormal or we need to change your treatment, we will call you to review the results. ? ?Testing/Procedures: ?Your physician has requested that you have cardiac CT. Cardiac computed tomography (CT) is a painless test that uses an x-ray machine to take clear, detailed pictures of your heart. For further information please visit HugeFiesta.[email or web address]. Please follow instruction sheet as given. ? ?Follow-Up: ?At [facility], you and your health needs are our priority.  As part of our continuing mission to provide you with exceptional heart care, we have created designated Provider Care Teams.  These Care Teams include your primary Cardiologist (physician) and Advanced Practice Providers (APPs -  Physician Assistants and Nurse Practitioners) who all work together to provide you with the care you need, when you need it. ? ?Your next appointment:   ?6 month(s) ? ?The format for your next appointment:   ?In Person ? ?Provider:   ?Janina Mayo, MD   ? ?Other Instructions ? ? ?Your cardiac CT will be scheduled at one of the below locations:  ? ?[facility] ?[address] ?[place], [place] [address] ?[phone] ? ?If scheduled at [facility], please arrive at the [facility] and Children's Entrance (Entrance C2) of [facility]  30 minutes prior to test start time. ?You can use the FREE valet parking offered at entrance C (encouraged to control the heart rate for the test)  ?Proceed to the [facility] Radiology Department (first floor) to ch

## 2022-01-28 ENCOUNTER — Other Ambulatory Visit: Payer: Self-pay | Admitting: Internal Medicine

## 2022-01-28 ENCOUNTER — Ambulatory Visit (HOSPITAL_COMMUNITY): Payer: BC Managed Care – PPO | Attending: Cardiovascular Disease

## 2022-01-28 DIAGNOSIS — R7989 Other specified abnormal findings of blood chemistry: Secondary | ICD-10-CM

## 2022-01-28 DIAGNOSIS — Q231 Congenital insufficiency of aortic valve: Secondary | ICD-10-CM | POA: Diagnosis not present

## 2022-01-28 LAB — ECHOCARDIOGRAM COMPLETE
AR max vel: 1.07 cm2
AV Area VTI: 1.05 cm2
AV Area mean vel: 0.97 cm2
AV Mean grad: 12.6 mmHg
AV Peak grad: 20.7 mmHg
Ao pk vel: 2.28 m/s
Area-P 1/2: 3.91 cm2
S' Lateral: 3.1 cm

## 2022-02-04 ENCOUNTER — Other Ambulatory Visit (HOSPITAL_COMMUNITY): Payer: Self-pay | Admitting: Pulmonary Disease

## 2022-02-04 ENCOUNTER — Other Ambulatory Visit: Payer: Self-pay | Admitting: Pulmonary Disease

## 2022-02-04 ENCOUNTER — Telehealth (HOSPITAL_COMMUNITY): Payer: Self-pay | Admitting: Emergency Medicine

## 2022-02-04 DIAGNOSIS — Q231 Congenital insufficiency of aortic valve: Secondary | ICD-10-CM

## 2022-02-04 NOTE — Telephone Encounter (Signed)
Reaching out to patient to offer assistance regarding upcoming cardiac imaging study; pt verbalizes understanding of appt date/time, parking situation and where to check in, pre-test NPO status and verified current allergies; name and call back number provided for further questions should they arise ?Marchia Bond RN Navigator Cardiac Imaging ?[facility] ?[phone] office ?[phone] cell ? ?Clarified with Phineas Inches, MD about the order for CCTA vs CTA chest aorta. Her primary goal for scan is to look at the AoV and not the coronaries so I changed the order and let the pre-cert department know of the change. We can hopefully keep the appt as scheduled.  ?Clarise Cruz  ?

## 2022-02-05 ENCOUNTER — Ambulatory Visit (HOSPITAL_COMMUNITY)
Admission: RE | Admit: 2022-02-05 | Discharge: 2022-02-05 | Disposition: A | Discharge status: Home / Self Care | Payer: BC Managed Care – PPO | Source: Ambulatory Visit | Attending: Internal Medicine | Admitting: Internal Medicine

## 2022-02-05 DIAGNOSIS — Q249 Congenital malformation of heart, unspecified: Secondary | ICD-10-CM | POA: Insufficient documentation

## 2022-02-05 DIAGNOSIS — Q231 Congenital insufficiency of aortic valve: Secondary | ICD-10-CM | POA: Insufficient documentation

## 2022-02-05 MED ORDER — IOHEXOL 350 MG/ML SOLN
100.0000 mL | Freq: Once | INTRAVENOUS | Status: AC | PRN
  Administered 2022-02-05: 100 mL via INTRAVENOUS

## 2022-02-06 DIAGNOSIS — E119 Type 2 diabetes mellitus without complications: Secondary | ICD-10-CM | POA: Diagnosis not present

## 2022-02-06 DIAGNOSIS — Q231 Congenital insufficiency of aortic valve: Secondary | ICD-10-CM | POA: Diagnosis not present

## 2022-02-06 DIAGNOSIS — H4720 Unspecified optic atrophy: Secondary | ICD-10-CM | POA: Diagnosis not present

## 2022-02-06 DIAGNOSIS — Q969 Turner's syndrome, unspecified: Secondary | ICD-10-CM | POA: Diagnosis not present

## 2022-02-06 DIAGNOSIS — H5213 Myopia, bilateral: Secondary | ICD-10-CM | POA: Diagnosis not present

## 2022-02-06 DIAGNOSIS — H01002 Unspecified blepharitis right lower eyelid: Secondary | ICD-10-CM | POA: Diagnosis not present

## 2022-02-06 DIAGNOSIS — I350 Nonrheumatic aortic (valve) stenosis: Secondary | ICD-10-CM | POA: Diagnosis not present

## 2022-02-09 ENCOUNTER — Ambulatory Visit
Admission: RE | Admit: 2022-02-09 | Discharge: 2022-02-09 | Disposition: A | Discharge status: Home / Self Care | Payer: BC Managed Care – PPO | Source: Ambulatory Visit | Attending: Internal Medicine | Admitting: Internal Medicine

## 2022-02-09 DIAGNOSIS — R7989 Other specified abnormal findings of blood chemistry: Secondary | ICD-10-CM

## 2022-02-09 DIAGNOSIS — R945 Abnormal results of liver function studies: Secondary | ICD-10-CM | POA: Diagnosis not present

## 2022-02-09 DIAGNOSIS — K8020 Calculus of gallbladder without cholecystitis without obstruction: Secondary | ICD-10-CM | POA: Diagnosis not present

## 2022-02-13 ENCOUNTER — Other Ambulatory Visit: Payer: BC Managed Care – PPO

## 2022-02-27 DIAGNOSIS — E7800 Pure hypercholesterolemia, unspecified: Secondary | ICD-10-CM | POA: Diagnosis not present

## 2022-02-27 DIAGNOSIS — Z5181 Encounter for therapeutic drug level monitoring: Secondary | ICD-10-CM | POA: Diagnosis not present

## 2022-03-25 DIAGNOSIS — L718 Other rosacea: Secondary | ICD-10-CM | POA: Diagnosis not present

## 2022-03-25 DIAGNOSIS — H16403 Unspecified corneal neovascularization, bilateral: Secondary | ICD-10-CM | POA: Diagnosis not present

## 2022-03-25 DIAGNOSIS — H179 Unspecified corneal scar and opacity: Secondary | ICD-10-CM | POA: Diagnosis not present

## 2022-05-11 DIAGNOSIS — H179 Unspecified corneal scar and opacity: Secondary | ICD-10-CM | POA: Diagnosis not present

## 2022-05-11 DIAGNOSIS — L718 Other rosacea: Secondary | ICD-10-CM | POA: Diagnosis not present

## 2022-05-11 DIAGNOSIS — Z9889 Other specified postprocedural states: Secondary | ICD-10-CM | POA: Diagnosis not present

## 2022-05-11 DIAGNOSIS — H16403 Unspecified corneal neovascularization, bilateral: Secondary | ICD-10-CM | POA: Diagnosis not present

## 2022-06-30 ENCOUNTER — Other Ambulatory Visit: Payer: Self-pay | Admitting: Obstetrics and Gynecology

## 2022-06-30 DIAGNOSIS — Z1231 Encounter for screening mammogram for malignant neoplasm of breast: Secondary | ICD-10-CM

## 2022-07-24 ENCOUNTER — Encounter: Payer: Self-pay | Admitting: Internal Medicine

## 2022-07-24 ENCOUNTER — Ambulatory Visit: Payer: BC Managed Care – PPO | Attending: Internal Medicine | Admitting: Internal Medicine

## 2022-07-24 VITALS — BP 120/87 | HR 79 | Ht 60.0 in | Wt 204.4 lb

## 2022-07-24 DIAGNOSIS — Q231 Congenital insufficiency of aortic valve: Secondary | ICD-10-CM

## 2022-07-24 NOTE — Patient Instructions (Signed)
Medication Instructions:  No Changes In Medications at this time.  *If you need a refill on your cardiac medications before your next appointment, please call your pharmacy*  Lab Work: None Ordered At This Time.  If you have labs (blood work) drawn today and your tests are completely normal, you will receive your results only by: MyChart Message (if you have MyChart) OR A paper copy in the mail If you have any lab test that is abnormal or we need to change your treatment, we will call you to review the results.  Testing/Procedures: None Ordered At This Time.   Follow-Up: At [facility], you and your health needs are our priority.  As part of our continuing mission to provide you with exceptional heart care, we have created designated Provider Care Teams.  These Care Teams include your primary Cardiologist (physician) and Advanced Practice Providers (APPs -  Physician Assistants and Nurse Practitioners) who all work together to provide you with the care you need, when you need it.  Your next appointment:   1 year(s)  The format for your next appointment:   In Person  Provider:   Branch, Mary E, MD           

## 2022-07-24 NOTE — Progress Notes (Signed)
Cardiology Office Note:    Date:  07/24/2022   ID:  Tiffany Cortez, DOB [birth year], MRN [id number]  PCP:  Lavone Orn, MD   [facility] Cardiologist:  Janina Mayo, MD     Referring MD: Lavone Orn, MD   No chief complaint on file. Bicuspid Aortic Valve  History of Present Illness:    Tiffany Cortez is a 46 y.o. female with a hx of DM (A1c 6.2%) on metformin, HTN, MGUS, PE, referral for bicuspid aortic valve  She notes she has Turner's syndrome. She gets serial imaging. She's had no imaging of her aorta. She has an echo pending. No arrhythmia. She's not had any heart catheterizations or stress tests.  She had a PE in 2015; she was on xarelto. She took it for a few months. She was on birth control at that time.   Family Hx: no family hx of congenital heart disease. Paternal GF had an MI in the 93s. MGM had DVT.   Social Hx: no smoking. She's working. 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 CT angio did not include read for aorta. Otherwise she is asymptomatic  Cardiology Studies: TTE 01/28/2022- AV not well visualized TTE 03/18/2018 - EF normal, Normal RV function. moderate aortic stenosis, calcified raphe c/f bicuspid AV, mean gradient 22 mmHg. Ascending aorta 39 mm  Past Medical History:  Diagnosis Date   Aortic insufficiency    Aortic stenosis    Atypical nevi    Bicuspid doming of aortic cusp    Diabetes mellitus without complication (HCC)    Hypertension    IBS (irritable bowel syndrome)    MILD   Left ventricular diastolic dysfunction    stage 1   MGUS (monoclonal gammopathy of unknown significance) 06/17/2015   Mild dilation of ascending aorta (HCC)    Obesity    Pulmonary embolism (HCC)    Pulmonary hyperinflation    Recurrent otitis media    Rosacea    Turner syndrome    Vulvar dystrophy     No past surgical  history on file.  Current Medications: No outpatient medications have been marked as taking for the 07/24/22 encounter (Appointment) with Janina Mayo, MD.     Allergies:   Patient has no known allergies.   Social History   Socioeconomic History   Marital status: Single    Spouse name: Not on file   Number of children: Not on file   Years of education: Not on file   Highest education level: Not on file  Occupational History   Not on file  Tobacco Use   Smoking status: Never   Smokeless tobacco: Not on file  Substance and Sexual Activity   Alcohol use: Not on file   Drug use: Not on file   Sexual activity: Not on file  Other Topics Concern   Not on file  Social History Narrative   Not on file   Social Determinants of Health   Financial Resource Strain: Not on file  Food Insecurity: Not on file  Transportation Needs: Not on file  Physical Activity: Not on file  Stress: Not on file  Social Connections: Not on file     Family History: The patient's family history includes Colon cancer in her maternal aunt; Deep vein thrombosis in her maternal grandmother; Diabetes in her father;  Hypertension in her father and mother; Obesity in her father. There is no history of Breast cancer.  ROS:   Please see the history of present illness.     All other systems reviewed and are negative.  EKGs/Labs/Other Studies Reviewed:    The following studies were reviewed today:   EKG:  EKG is  ordered today.  The ekg ordered today demonstrates   EKG 01/22/2022- NSR    Recent Labs: 01/22/2022: BUN 14; Creatinine, Ser 0.65; Potassium 4.2; Sodium 139   Recent Lipid Panel No results found for: "CHOL", "TRIG", "HDL", "CHOLHDL", "VLDL", "LDLCALC", "LDLDIRECT"   Risk Assessment/Calculations:     ASCVD 2.3%      Physical Exam:    VS:   Vitals:   07/24/22 0857  BP: 120/87  Pulse: 79  SpO2: 99%     Wt Readings from Last 3 Encounters:  01/22/22 206 lb 12.8 oz (93.8 kg)   10/24/21 210 lb (95.3 kg)  12/28/16 223 lb (101.2 kg)     GEN:  Well nourished, well developed in no acute distress HEENT: Normal NECK: No JVD; No carotid bruits LYMPHATICS: No lymphadenopathy CARDIAC: RRR, no murmurs, rubs, gallops RESPIRATORY:  Clear to auscultation without rales, wheezing or rhonchi  ABDOMEN: Soft, non-tender, non-distended MUSCULOSKELETAL:  No edema; No deformity  SKIN: Warm and dry NEUROLOGIC:  Alert and oriented x 3 PSYCHIATRIC:  Normal affect   ASSESSMENT:    #Bicuspid Aortic Valve: Patient has hx of Turner's syndrome. She has hx of BAV. Noted to have moderate AS mean gradient of 22 mmHg in 2019. She has calcification along the raphe. Will FU CT to assess for aortopathy. Will plan for screening echocardiogram q 1 -2 years. Next echo due 2025 unless stenosis has progressed.   #HTN- Well controlled. continue lisinopril 20 mg daily. BP at goal < 130/80 mmHg with risk of aortopathy     PLAN:    In order of problems listed above:   Follow 12 months         Medication Adjustments/Labs and Tests Ordered: Current medicines are reviewed at length with the patient today.  Concerns regarding medicines are outlined above.  No orders of the defined types were placed in this encounter.  No orders of the defined types were placed in this encounter.   There are no Patient Instructions on file for this visit.   Signed, Janina Mayo, MD  07/24/2022 8:02 AM    [place]

## 2022-08-05 ENCOUNTER — Other Ambulatory Visit (HOSPITAL_COMMUNITY): Payer: Self-pay | Admitting: Internal Medicine

## 2022-08-05 ENCOUNTER — Telehealth (HOSPITAL_COMMUNITY): Payer: Self-pay | Admitting: Emergency Medicine

## 2022-08-05 ENCOUNTER — Other Ambulatory Visit: Payer: Self-pay | Admitting: Internal Medicine

## 2022-08-05 DIAGNOSIS — Q231 Congenital insufficiency of aortic valve: Secondary | ICD-10-CM

## 2022-08-05 NOTE — Telephone Encounter (Signed)
Calling patient to inform her that we will need to do another CT scan of her heart to get more information that the initial scan didn't provide.   per pt shes not sure when she will be able to have this ct scan done, she said that her mom is really sick...  then she also mentioned she needs a test done on her eye and the MD was concerned about her bp being to low, might not be safe for her eyes and asked for someone to contact her eye doctor to see if this scan is okay ..the office number is [phone]  [facility] [phone] Office  [phone] Cell

## 2022-08-06 ENCOUNTER — Ambulatory Visit: Payer: BC Managed Care – PPO

## 2022-08-06 NOTE — Telephone Encounter (Signed)
Attempted to call patient, left message for patient to call back to office.   

## 2022-08-06 NOTE — Telephone Encounter (Signed)
Patient returned RN's call and stated she can be reached on her cell - [phone].

## 2022-08-06 NOTE — Telephone Encounter (Signed)
Spoke with patient of Dr. 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  Her mother is very sick but she is willing to schedule test when able  Opthalmology office # is in message from [place] below  Routed to Dr. Harl Bowie to review

## 2022-08-07 DIAGNOSIS — I10 Essential (primary) hypertension: Secondary | ICD-10-CM | POA: Diagnosis not present

## 2022-08-07 DIAGNOSIS — I350 Nonrheumatic aortic (valve) stenosis: Secondary | ICD-10-CM | POA: Diagnosis not present

## 2022-08-07 DIAGNOSIS — Q969 Turner's syndrome, unspecified: Secondary | ICD-10-CM | POA: Diagnosis not present

## 2022-08-07 DIAGNOSIS — E785 Hyperlipidemia, unspecified: Secondary | ICD-10-CM | POA: Diagnosis not present

## 2022-08-07 DIAGNOSIS — E119 Type 2 diabetes mellitus without complications: Secondary | ICD-10-CM | POA: Diagnosis not present

## 2022-08-07 NOTE — Telephone Encounter (Signed)
Tiffany Mayo, MD  Fidel Levy, RN Cc: Chriss Driver, RN Caller: Unspecified (2 days ago, 10:14 AM) Thank you ! Eliezer Lofts B is working on calling her back. No indication for ivabradine. Metop is ok. I spoke with the ophthalmologist's office.

## 2022-08-07 NOTE — Telephone Encounter (Signed)
Returned call to patient and made her aware of Dr. Nelly Cortez recommendations. Patient is ready to get CCTA scheduled. Will forward message back. Advised patient to call back to office with any issues, questions, or concerns. Patient verbalized understanding.

## 2022-08-18 ENCOUNTER — Telehealth: Payer: Self-pay

## 2022-08-18 DIAGNOSIS — Q231 Congenital insufficiency of aortic valve: Secondary | ICD-10-CM

## 2022-08-18 NOTE — Telephone Encounter (Signed)
Called and spoke with patient regarding BMET needing to be completed prior to CCTA scheduled for 10/27- order placed. Patient will come into office tomorrow to have this completed. Patient aware of all instructions and verbalized understanding.

## 2022-08-19 DIAGNOSIS — Q231 Congenital insufficiency of aortic valve: Secondary | ICD-10-CM | POA: Diagnosis not present

## 2022-08-20 ENCOUNTER — Telehealth (HOSPITAL_COMMUNITY): Payer: Self-pay | Admitting: Emergency Medicine

## 2022-08-20 LAB — BASIC METABOLIC PANEL WITH GFR
BUN/Creatinine Ratio: 16 (ref 9–23)
BUN: 12 mg/dL (ref 6–24)
CO2: 24 mmol/L (ref 20–29)
Calcium: 9.4 mg/dL (ref 8.7–10.2)
Chloride: 100 mmol/L (ref 96–106)
Creatinine, Ser: 0.73 mg/dL (ref 0.57–1.00)
Glucose: 261 mg/dL — ABNORMAL HIGH (ref 70–99)
Potassium: 4 mmol/L (ref 3.5–5.2)
Sodium: 139 mmol/L (ref 134–144)
eGFR: 101 mL/min/1.73

## 2022-08-20 NOTE — Telephone Encounter (Signed)
Reaching out to patient to offer assistance regarding upcoming cardiac imaging study; pt verbalizes understanding of appt date/time, parking situation and where to check in, pre-test NPO status and medications ordered, and verified current allergies; name and call back number provided for further questions should they arise Marchia Bond RN [facility] [phone] office [phone] cell  Arrival 900 w/c entrance '100mg'$  metoprolol  Denies iv issues

## 2022-08-21 ENCOUNTER — Ambulatory Visit (HOSPITAL_COMMUNITY)
Admission: RE | Admit: 2022-08-21 | Discharge: 2022-08-21 | Disposition: A | Discharge status: Home / Self Care | Payer: BC Managed Care – PPO | Source: Ambulatory Visit | Attending: Internal Medicine | Admitting: Internal Medicine

## 2022-08-21 DIAGNOSIS — Q231 Congenital insufficiency of aortic valve: Secondary | ICD-10-CM | POA: Diagnosis not present

## 2022-08-21 MED ORDER — NITROGLYCERIN 0.4 MG SL SUBL
SUBLINGUAL_TABLET | SUBLINGUAL | Status: AC
  Filled 2022-08-21: qty 2

## 2022-08-21 MED ORDER — NITROGLYCERIN 0.4 MG SL SUBL
0.8000 mg | SUBLINGUAL_TABLET | Freq: Once | SUBLINGUAL | Status: AC
  Administered 2022-08-21: 0.8 mg via SUBLINGUAL

## 2022-08-21 MED ORDER — IOHEXOL 350 MG/ML SOLN
100.0000 mL | Freq: Once | INTRAVENOUS | Status: AC | PRN
  Administered 2022-08-21: 100 mL via INTRAVENOUS

## 2022-08-24 ENCOUNTER — Encounter: Payer: Self-pay | Admitting: Internal Medicine

## 2022-09-08 ENCOUNTER — Ambulatory Visit: Payer: BC Managed Care – PPO

## 2022-11-03 ENCOUNTER — Ambulatory Visit
Admission: RE | Admit: 2022-11-03 | Discharge: 2022-11-03 | Disposition: A | Discharge status: Home / Self Care | Payer: BC Managed Care – PPO | Source: Ambulatory Visit | Attending: Obstetrics and Gynecology | Admitting: Obstetrics and Gynecology

## 2022-11-03 DIAGNOSIS — Z1231 Encounter for screening mammogram for malignant neoplasm of breast: Secondary | ICD-10-CM

## 2023-02-23 ENCOUNTER — Encounter: Payer: Self-pay | Admitting: Internal Medicine

## 2023-02-24 ENCOUNTER — Other Ambulatory Visit: Payer: Self-pay | Admitting: Internal Medicine

## 2023-02-24 DIAGNOSIS — E2839 Other primary ovarian failure: Secondary | ICD-10-CM

## 2023-07-09 ENCOUNTER — Ambulatory Visit: Payer: BC Managed Care – PPO | Attending: Internal Medicine | Admitting: Internal Medicine

## 2023-07-09 ENCOUNTER — Encounter: Payer: Self-pay | Admitting: Internal Medicine

## 2023-07-09 VITALS — BP 126/98 | HR 80 | Ht 61.0 in | Wt 201.8 lb

## 2023-07-09 DIAGNOSIS — Q231 Congenital insufficiency of aortic valve: Secondary | ICD-10-CM | POA: Diagnosis not present

## 2023-07-09 NOTE — Patient Instructions (Signed)
Medication Instructions:  Your physician recommends that you continue on your current medications as directed. Please refer to the Current Medication list given to you today.  *If you need a refill on your cardiac medications before your next appointment, please call your pharmacy*   Lab Work: NONE If you have labs (blood work) drawn today and your tests are completely normal, you will receive your results only by: MyChart Message (if you have MyChart) OR A paper copy in the mail If you have any lab test that is abnormal or we need to change your treatment, we will call you to review the results.   Testing/Procedures: Your physician has requested that you have an echocardiogram. Echocardiography is a painless test that uses sound waves to create images of your heart. It provides your doctor with information about the size and shape of your heart and how well your heart's chambers and valves are working. This procedure takes approximately one hour. There are no restrictions for this procedure. Please do NOT wear cologne, perfume, aftershave, or lotions (deodorant is allowed). Please arrive 15 minutes prior to your appointment time. TO BE DONE IN APRIL 2025   Follow-Up: At [facility], you and your health needs are our priority.  As part of our continuing mission to provide you with exceptional heart care, we have created designated Provider Care Teams.  These Care Teams include your primary Cardiologist (physician) and Advanced Practice Providers (APPs -  Physician Assistants and Nurse Practitioners) who all work together to provide you with the care you need, when you need it.  We recommend signing up for the patient portal called "MyChart".  Sign up information is provided on this After Visit Summary.  MyChart is used to connect with patients for Virtual Visits (Telemedicine).  Patients are able to view lab/test results, encounter notes, upcoming appointments, etc.  Non-urgent  messages can be sent to your provider as well.   To learn more about what you can do with MyChart, go to [email or web address].    Your next appointment:   1 year(s)  Provider:   Maisie Fus, MD

## 2023-07-09 NOTE — Progress Notes (Signed)
Cardiology Office Note:    Date:  07/09/2023   ID:  Tiffany Cortez, DOB [birth year], MRN [id number]  PCP:  Thana Ates, MD   [facility] Providers Cardiologist:  Maisie Fus, MD     Referring MD: Thana Ates, MD   No chief complaint on file. Bicuspid Aortic Valve  History of Present Illness:    Tiffany Cortez is a 47 y.o. female with a hx of DM (A1c 6.2%) on metformin, HTN, MGUS, PE, referral for bicuspid aortic valve  She notes she has Turner's syndrome. She gets serial imaging. She's had no imaging of her aorta. She has an echo pending. No arrhythmia. She's not had any heart catheterizations or stress tests.  She had a PE in 2015; she was on xarelto. She took it for a few months. She was on birth control at that time.   Family Hx: no family hx of congenital heart disease. Paternal GF had an MI in the 27s. MGM had DVT.   Social Hx: no smoking. She's working. 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 CT angio did not include read for aorta. Otherwise she is asymptomatic   Interim hx 07/09/2023 She is doing well.  She denies any symptoms.  She's a Music therapist  Past Medical History:  Diagnosis Date   Aortic insufficiency    Aortic stenosis    Atypical nevi    Bicuspid doming of aortic cusp    Diabetes mellitus without complication (HCC)    Hypertension    IBS (irritable bowel syndrome)    MILD   Left ventricular diastolic dysfunction    stage 1   MGUS (monoclonal gammopathy of unknown significance) 06/17/2015   Mild dilation of ascending aorta (HCC)    Obesity    Pulmonary embolism (HCC)    Pulmonary hyperinflation    Recurrent otitis media    Rosacea    Turner syndrome    Vulvar dystrophy     No past surgical history on file.  Current Medications: No outpatient medications have been marked as taking for the 07/09/23  encounter (Appointment) with Maisie Fus, MD.     Allergies:   Patient has no known allergies.   Social History   Socioeconomic History   Marital status: Single    Spouse name: Not on file   Number of children: Not on file   Years of education: Not on file   Highest education level: Not on file  Occupational History   Not on file  Tobacco Use   Smoking status: Never   Smokeless tobacco: Not on file  Substance and Sexual Activity   Alcohol use: Not on file   Drug use: Not on file   Sexual activity: Not on file  Other Topics Concern   Not on file  Social History Narrative   Not on file   Social Determinants of Health   Financial Resource Strain: Not on file  Food Insecurity: Not on file  Transportation Needs: Not on file  Physical Activity: Not on file  Stress: Not on file  Social Connections: Not on file     Family History: The patient's family history includes Colon cancer in her maternal aunt; Deep vein thrombosis in her maternal grandmother; Diabetes in her father; Hypertension in her father and mother; Obesity in her father. There is  no history of Breast cancer.  ROS:   Please see the history of present illness.     All other systems reviewed and are negative.  EKGs/Labs/Other Studies Reviewed:    The following studies were reviewed today:   EKG:  EKG is  ordered today.  The ekg ordered today demonstrates   EKG 01/22/2022- NSR   EKG Interpretation Date/Time:  Friday July 09 2023 16:09:59 EDT Ventricular Rate:  80 PR Interval:  176 QRS Duration:  86 QT Interval:  386 QTC Calculation: 445 R Axis:   6  Text Interpretation: Normal sinus rhythm Nonspecific T wave abnormality No previous ECGs available Confirmed by Carolan Clines ([id number]) on 07/09/2023 4:12:32 PM   Cardiology Studies: TTE 01/28/2022- AV not well visualized TTE 03/18/2018 - EF normal, Normal RV function. moderate aortic stenosis, calcified raphe c/f bicuspid AV, mean gradient 22 mmHg.  Ascending aorta 39 mm   Recent Labs: 08/19/2022: BUN 12; Creatinine, Ser 0.73; Potassium 4.0; Sodium 139   Recent Lipid Panel No results found for: "CHOL", "TRIG", "HDL", "CHOLHDL", "VLDL", "LDLCALC", "LDLDIRECT"   Risk Assessment/Calculations:   CAC 13; 94th percentile  Physical Exam:    VS:  Vitals:   07/09/23 1607  BP: (!) 126/98  Pulse: 80  SpO2: 97%     Wt Readings from Last 3 Encounters:  07/24/22 204 lb 6.4 oz (92.7 kg)  01/22/22 206 lb 12.8 oz (93.8 kg)  10/24/21 210 lb (95.3 kg)     GEN:  Well nourished, well developed in no acute distress HEENT: Normal NECK: No JVD; No carotid bruits LYMPHATICS: No lymphadenopathy CARDIAC: RRR, no murmurs, rubs, gallops RESPIRATORY:  Clear to auscultation without rales, wheezing or rhonchi  ABDOMEN: Soft, non-tender, non-distended MUSCULOSKELETAL:  No edema; No deformity  SKIN: Warm and dry NEUROLOGIC:  Alert and oriented x 3 PSYCHIATRIC:  Normal affect   ASSESSMENT:    #Bicuspid Aortic Valve ( Sievers type 1: fusion of LCC/RCC):  Patient has hx of Turner's syndrome. She has hx of BAV. Noted to have moderate AS mean gradient of 22 mmHg in 2019. She has calcification along the raphe. CT showed no aneurysm. Has some CAC but low score. Will plan for screening echocardiogram q 1 -2 years. Next echo due 2025 unless stenosis has progressed.   #HTN- recommend ambulatory monitoring with high diastolic today. continue lisinopril 20 mg daily.  PLAN:    In order of problems listed above:   TTE 2025 Follow 12 months         Medication Adjustments/Labs and Tests Ordered: Current medicines are reviewed at length with the patient today.  Concerns regarding medicines are outlined above.  No orders of the defined types were placed in this encounter.  No orders of the defined types were placed in this encounter.   There are no Patient Instructions on file for this visit.   Signed, Maisie Fus, MD  07/09/2023 3:49 PM     [facility]

## 2023-09-06 IMAGING — MG MM DIGITAL SCREENING BILAT W/ TOMO AND CAD
8 of 14 series · 8 of 40 positions shown · non-contrast
Comparison: Previous exam(s).

CLINICAL DATA: Screening.

EXAM:
DIGITAL SCREENING BILATERAL MAMMOGRAM WITH TOMOSYNTHESIS AND CAD
TECHNIQUE: Bilateral screening digital craniocaudal and mediolateral oblique
mammograms were obtained. Bilateral screening digital breast
tomosynthesis was performed. The images were evaluated with
computer-aided detection.

[L MLO synth-2D (1 of 2)]
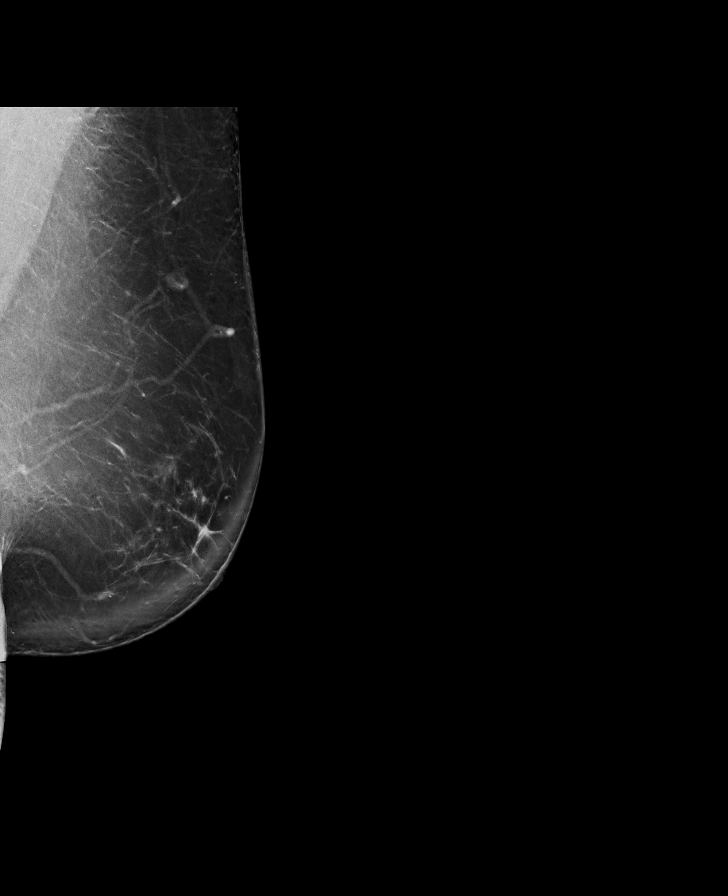

[L CC synth-2D (1 of 2)]
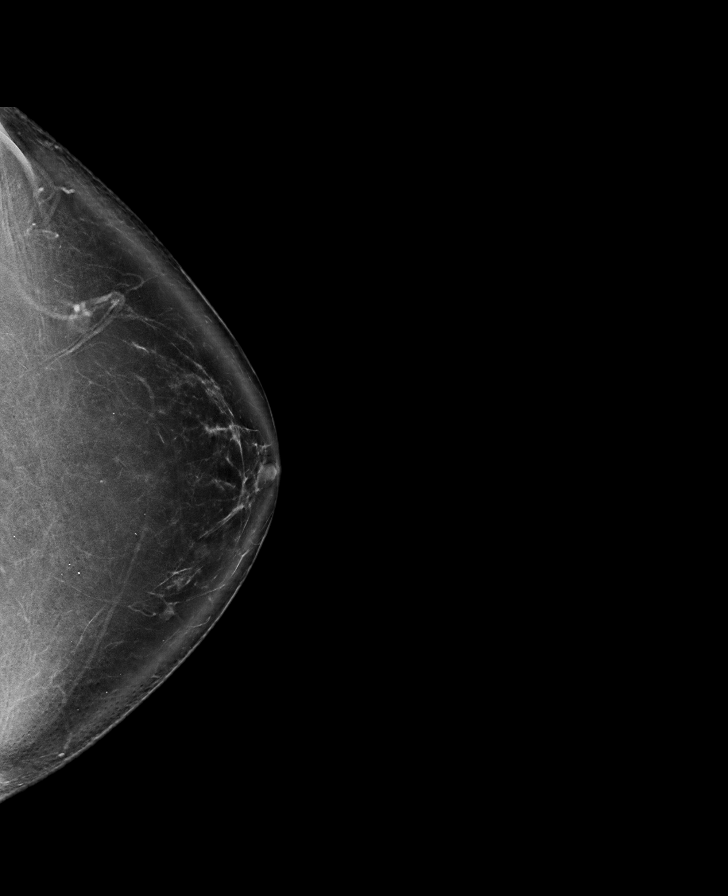

[L CC synth-2D (2 of 2)]
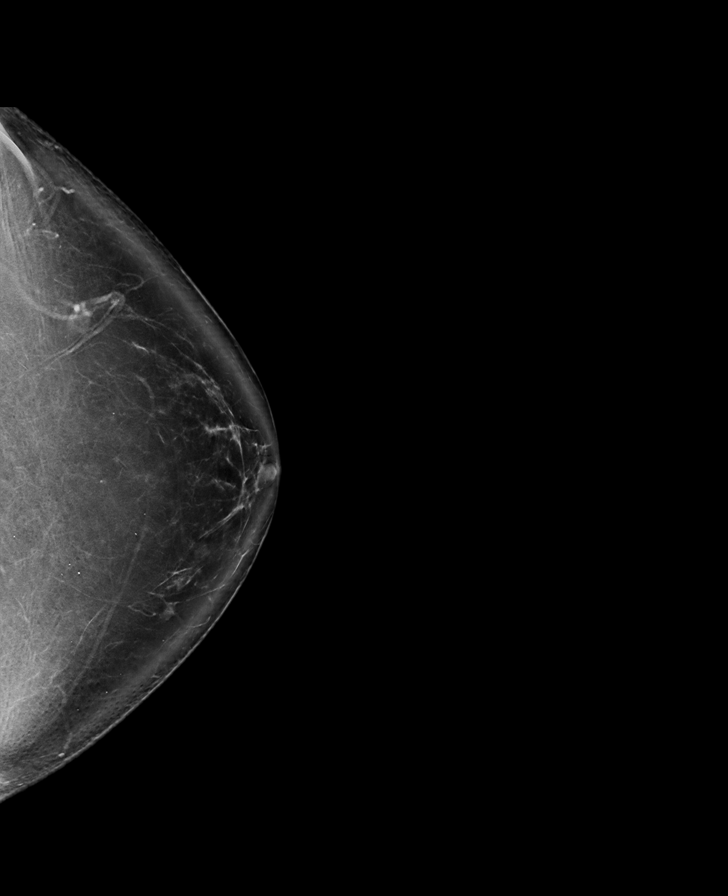

[R MLO synth-2D (1 of 2)]
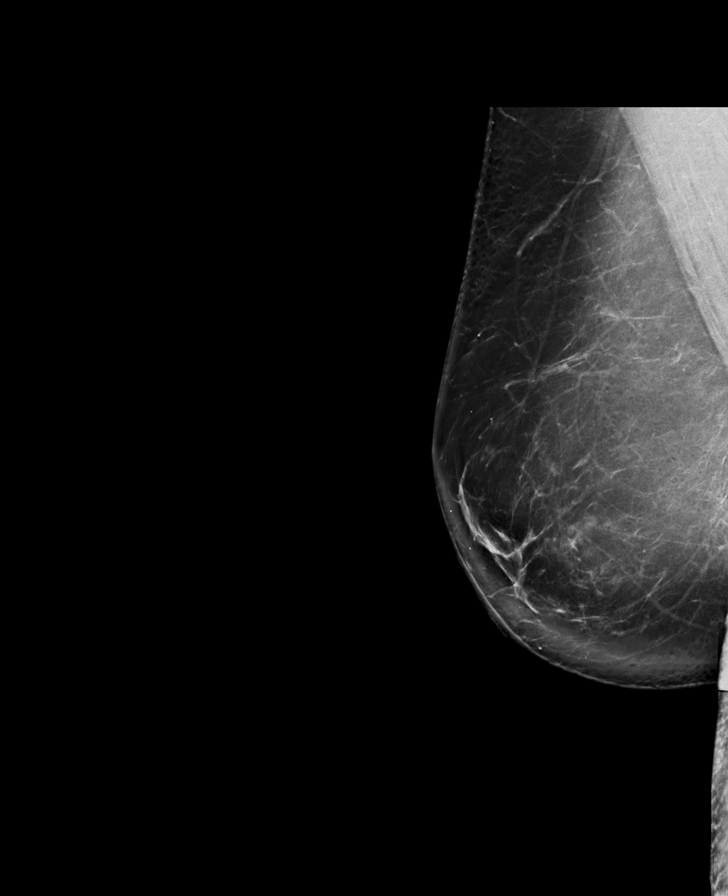

[L MLO synth-2D (2 of 2)]
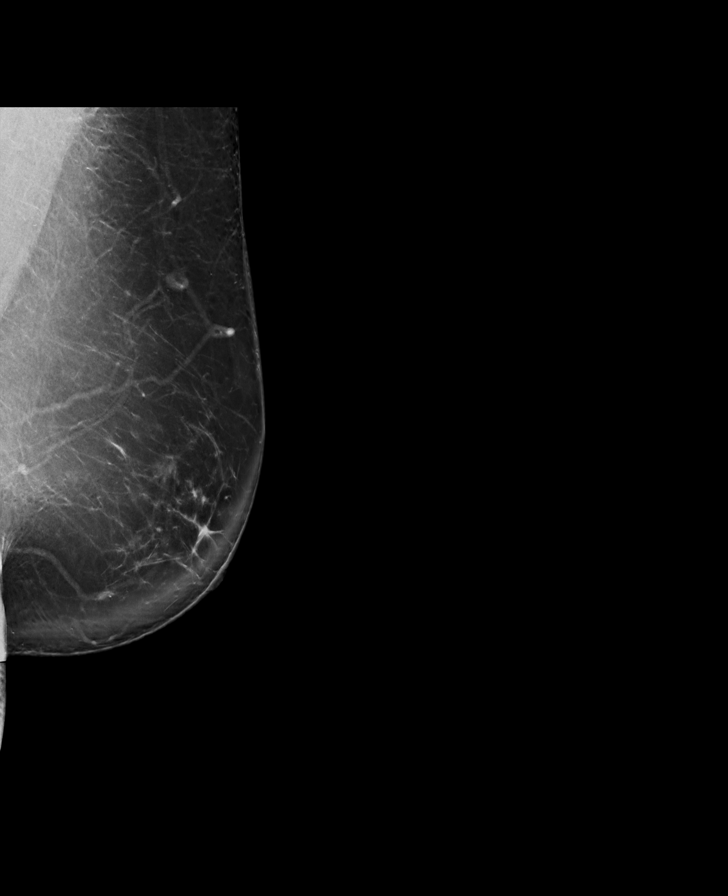

[R CC synth-2D]
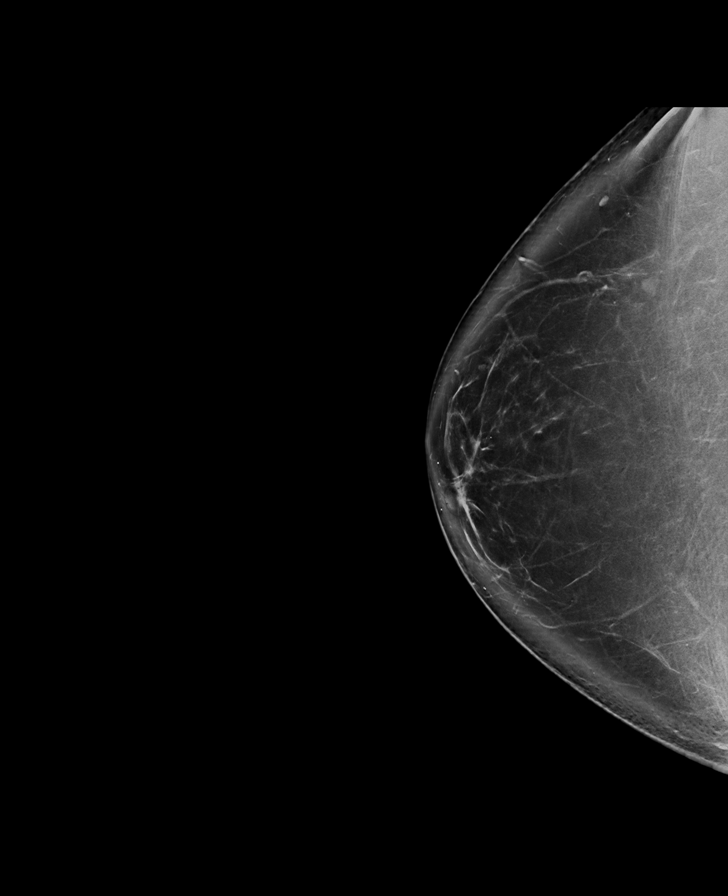

[R MLO synth-2D (2 of 2)]
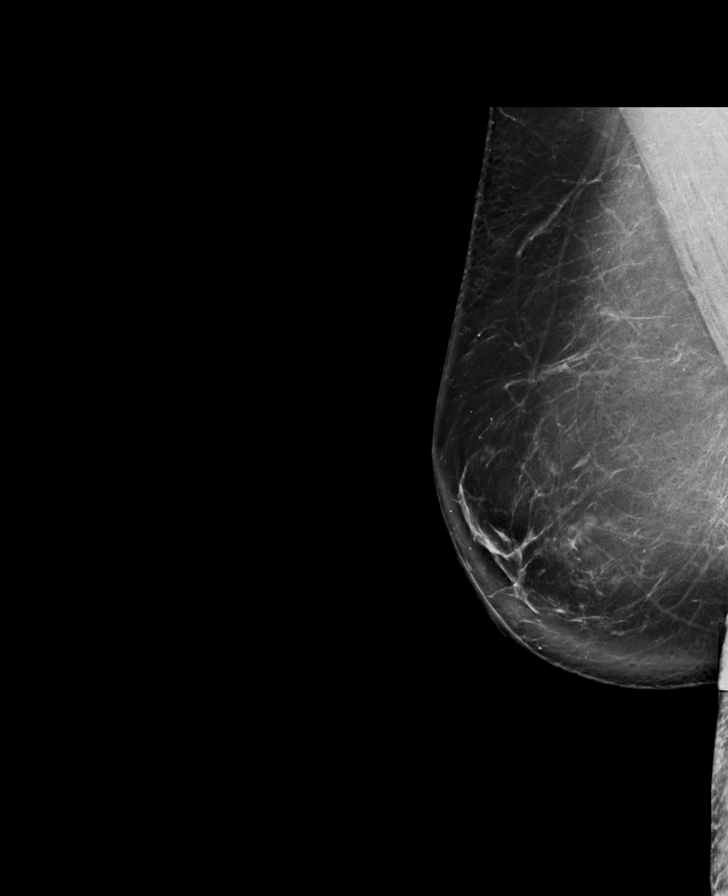

[L CC tomo · tomo slice 47/93.0]
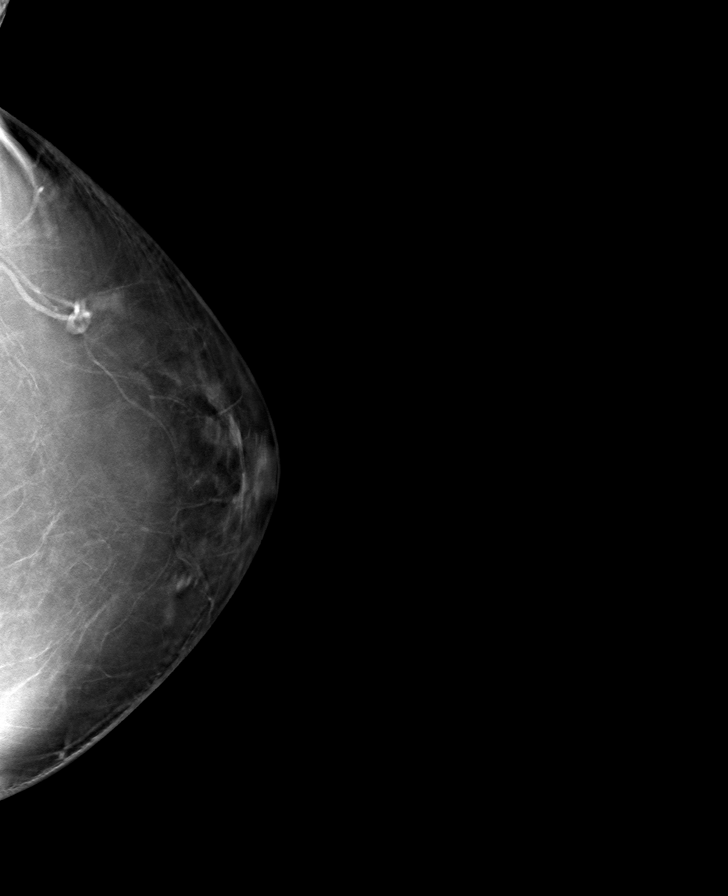

[8 of 40 positions shown; findings below may reference images not displayed]

ACR Breast Density Category b: There are scattered areas of
fibroglandular density.
FINDINGS: There are no findings suspicious for malignancy.
IMPRESSION: No mammographic evidence of malignancy. A result letter of this
screening mammogram will be mailed directly to the patient.

RECOMMENDATION:
Screening mammogram in one year. (Code:51-O-LD2)

BI-RADS CATEGORY  1: Negative.

## 2023-09-20 ENCOUNTER — Other Ambulatory Visit: Payer: Self-pay | Admitting: Internal Medicine

## 2023-09-20 DIAGNOSIS — Z1231 Encounter for screening mammogram for malignant neoplasm of breast: Secondary | ICD-10-CM

## 2023-09-24 ENCOUNTER — Other Ambulatory Visit: Payer: BC Managed Care – PPO

## 2023-10-15 ENCOUNTER — Ambulatory Visit
Admission: RE | Admit: 2023-10-15 | Discharge: 2023-10-15 | Disposition: A | Discharge status: Home / Self Care | Payer: BC Managed Care – PPO | Source: Ambulatory Visit | Attending: Internal Medicine | Admitting: Internal Medicine

## 2023-10-15 ENCOUNTER — Other Ambulatory Visit: Payer: BC Managed Care – PPO

## 2023-10-15 DIAGNOSIS — E2839 Other primary ovarian failure: Secondary | ICD-10-CM

## 2023-10-25 ENCOUNTER — Other Ambulatory Visit (HOSPITAL_COMMUNITY)
Admission: RE | Admit: 2023-10-25 | Discharge: 2023-10-25 | Disposition: A | Discharge status: Home / Self Care | Payer: BC Managed Care – PPO | Source: Ambulatory Visit | Attending: Obstetrics and Gynecology | Admitting: Obstetrics and Gynecology

## 2023-10-25 ENCOUNTER — Other Ambulatory Visit: Payer: Self-pay | Admitting: Obstetrics and Gynecology

## 2023-10-25 DIAGNOSIS — Z01419 Encounter for gynecological examination (general) (routine) without abnormal findings: Secondary | ICD-10-CM | POA: Insufficient documentation

## 2023-10-28 LAB — CYTOLOGY - PAP
Comment: NEGATIVE
Diagnosis: NEGATIVE
High risk HPV: NEGATIVE

## 2023-11-09 ENCOUNTER — Ambulatory Visit
Admission: RE | Admit: 2023-11-09 | Discharge: 2023-11-09 | Disposition: A | Discharge status: Home / Self Care | Payer: 59 | Source: Ambulatory Visit | Attending: Internal Medicine | Admitting: Internal Medicine

## 2023-11-09 DIAGNOSIS — Z1231 Encounter for screening mammogram for malignant neoplasm of breast: Secondary | ICD-10-CM

## 2024-02-04 ENCOUNTER — Ambulatory Visit (HOSPITAL_COMMUNITY): Payer: BC Managed Care – PPO | Attending: Cardiovascular Disease

## 2024-02-04 DIAGNOSIS — Q2381 Bicuspid aortic valve: Secondary | ICD-10-CM

## 2024-02-04 DIAGNOSIS — I342 Nonrheumatic mitral (valve) stenosis: Secondary | ICD-10-CM | POA: Diagnosis not present

## 2024-02-06 LAB — ECHOCARDIOGRAM COMPLETE
AR max vel: 1.31 cm2
AV Area VTI: 1.32 cm2
AV Area mean vel: 1.24 cm2
AV Mean grad: 15.6 mmHg
AV Peak grad: 27.2 mmHg
Ao pk vel: 2.61 m/s
Area-P 1/2: 4.63 cm2
S' Lateral: 2.6 cm

## 2024-02-28 NOTE — Progress Notes (Signed)
 Normal heart function.  Mild narrowing of aortic valve, mild enlargement of ascending aorta at 41 mm.  This can be monitored with serial echocardiogram, can be ordered at next visit with Dr. Chancy Comber in September.  Thanks MJP

## 2024-03-09 IMAGING — CT CTA CHEST W/ AND/OR W/O CM W/ OR W/O DISSECTION AND GATING
1 of 9 series · 2 of 16 positions shown, 3 images · IV contrast (APPLIED)
Comparison: None.

CLINICAL DATA: Bicuspid aortic valve

the following report is an over-read performed by radiologist Dr. SAXAVET
Moira [[id number]] on 02/05/2022. This over-read does
not include interpretation of cardiac or coronary anatomy or
pathology. The CTA interpretation by the cardiologist is attached.

[Series 10: arterial thins · axial · arterial · 0.71mm/px · z∈[-252,-167]mm · 2 of 428 slices shown, 3 images]
[im 143/428  soft-tissue]
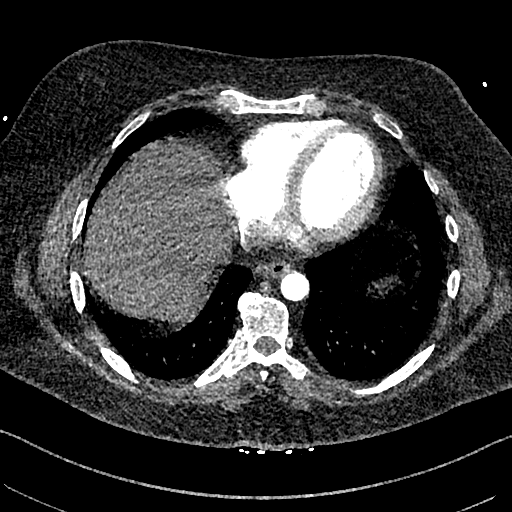
[im 143/428  bone]
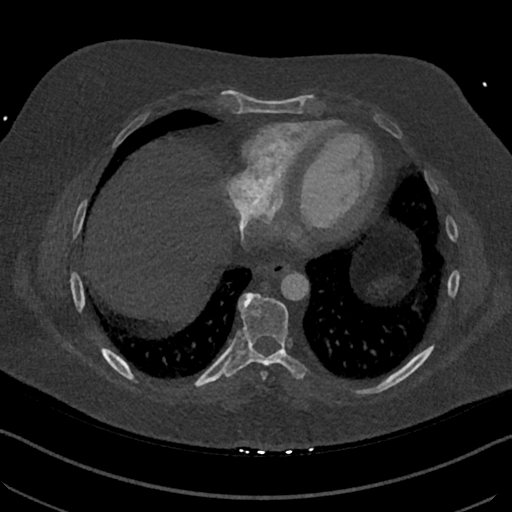
[im 285/428  soft-tissue]
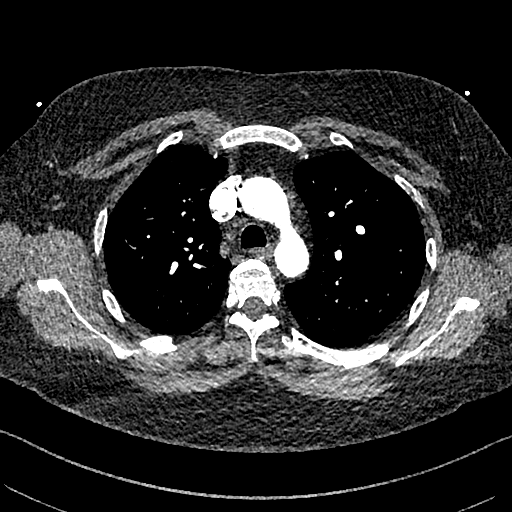

[2 of 16 positions shown; findings below may reference images not displayed]

FINDINGS: 3 mm punctate calcified granuloma in the right upper lobe, image
50/4.

The visualized portions of the lung fields are clear. No pleural
fluid seen.

No bulky adenopathy. Visualized portions of the upper abdomen are
unremarkable except for hepatic steatosis and cholelithiasis noted.
Gallbladder nondistended.

Degenerative changes throughout the spine. No acute compression
fracture. Sternum intact. No aggressive appearing bone lesions
identified.
IMPRESSION: No acute nonvascular intrathoracic finding.

Hepatic steatosis

Cholelithiasis

Remote granulomatous disease

## 2024-03-13 IMAGING — US US ABDOMEN LIMITED
1 series · 14 of 25 positions shown · non-contrast
Comparison: May 27, 2015.

CLINICAL DATA: Elevated liver function tests.

EXAM:
ULTRASOUND ABDOMEN LIMITED RIGHT UPPER QUADRANT

[Series 1: us abdomen limited · 0.22mm/px · 14 of 43 slices shown]
[im 1/43]
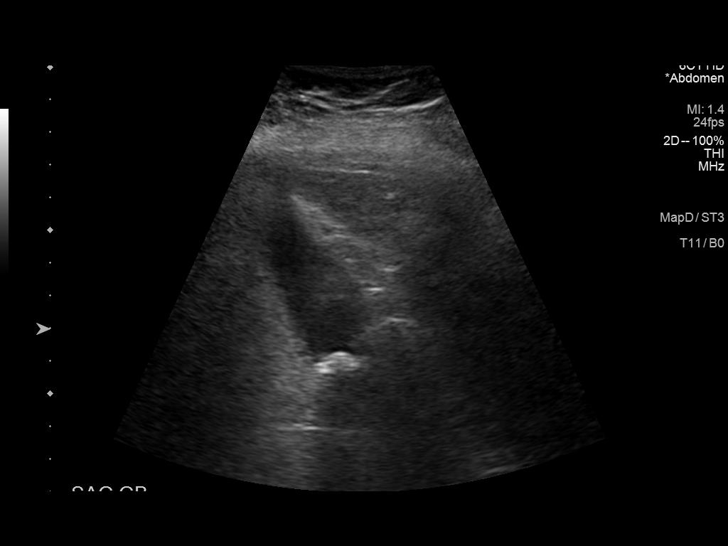
[im 4/43]
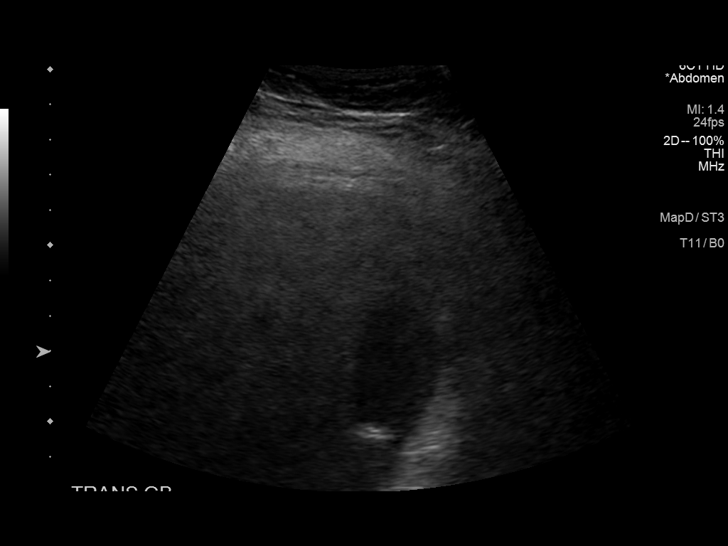
[im 8/43]
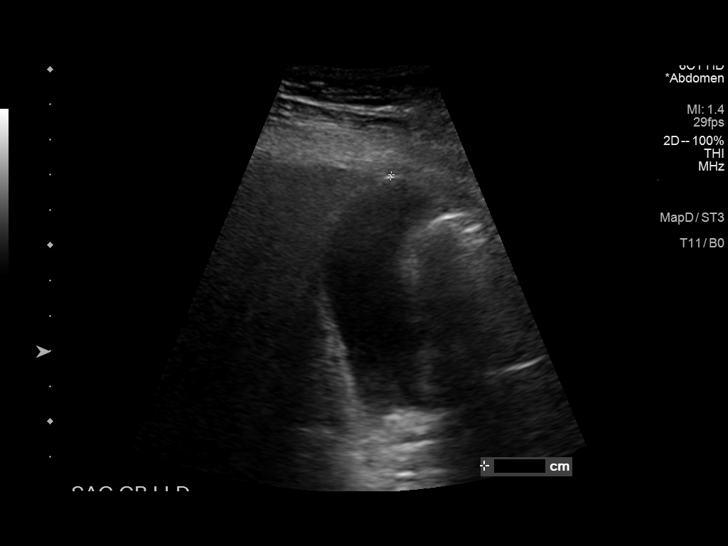
[im 11/43]
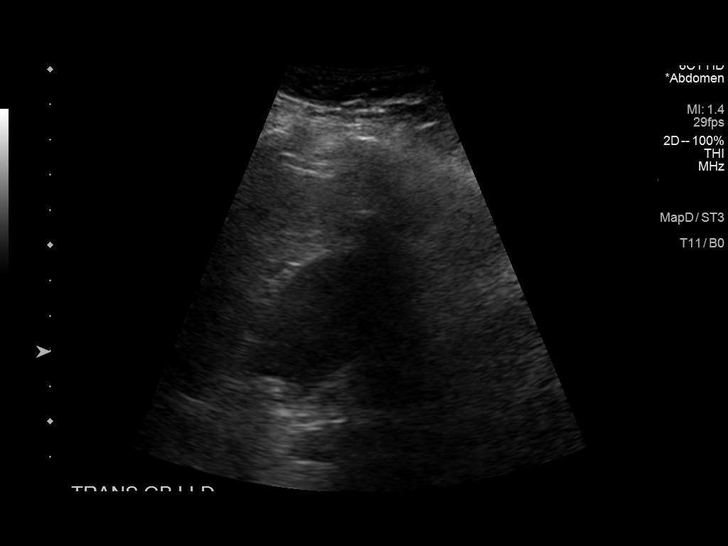
[im 15/43]
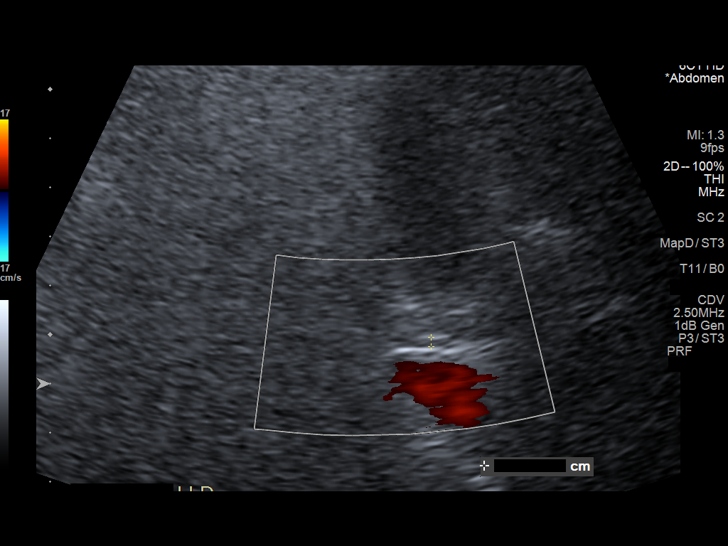
[im 16/43]
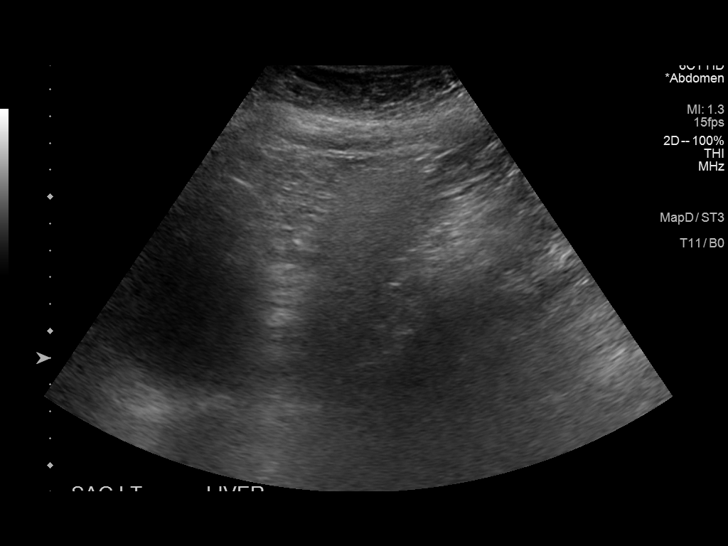
[im 20/43]
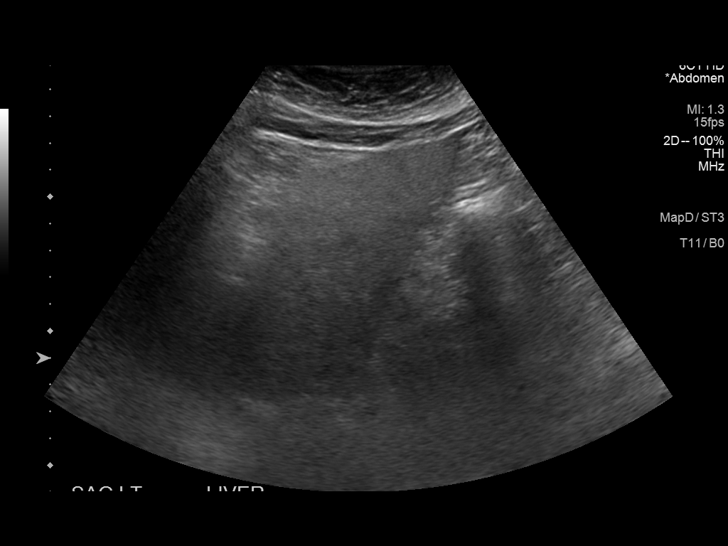
[im 23/43]
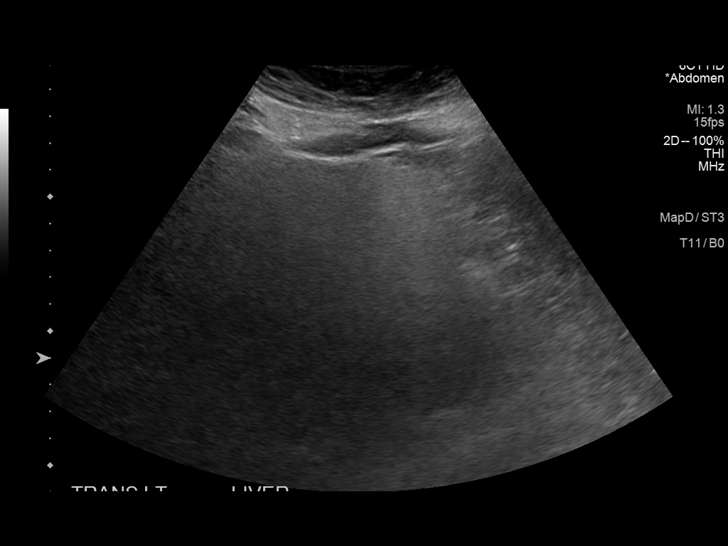
[im 27/43]
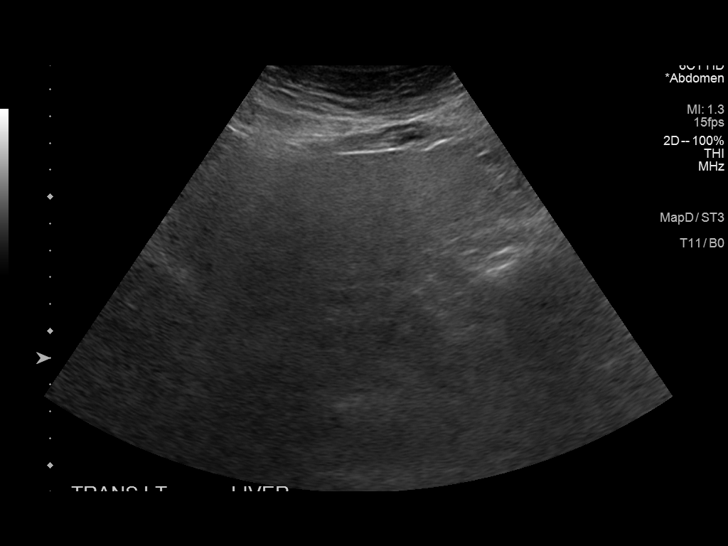
[im 29/43]
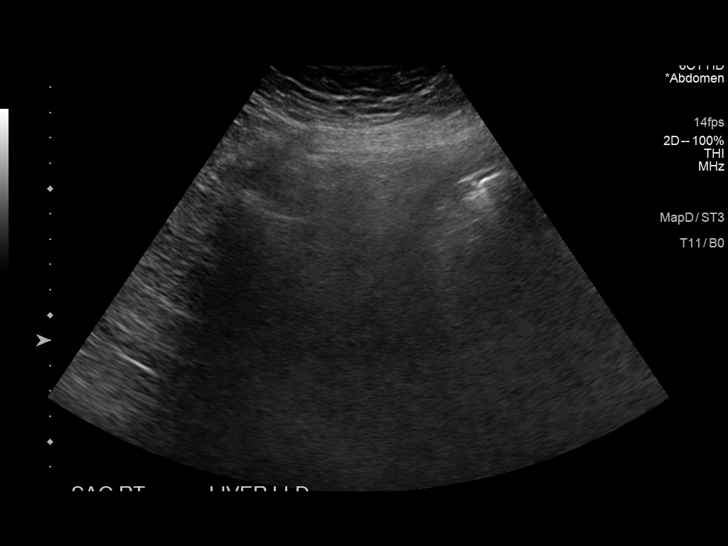
[im 32/43]
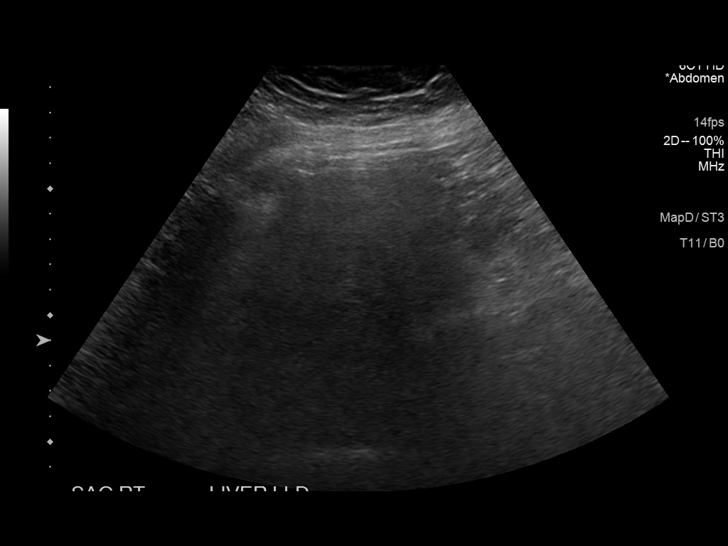
[im 36/43]
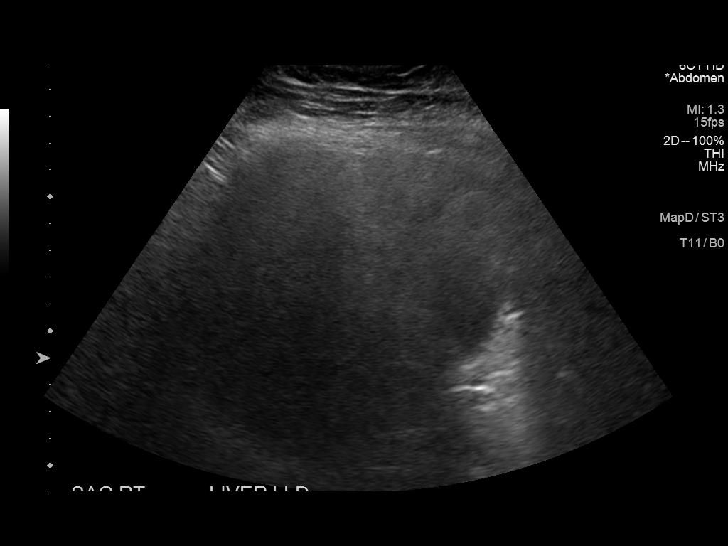
[im 39/43]
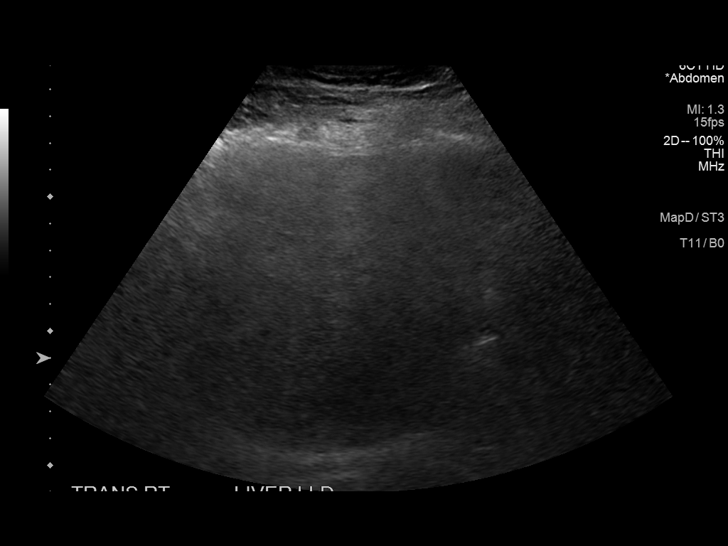
[im 43/43]
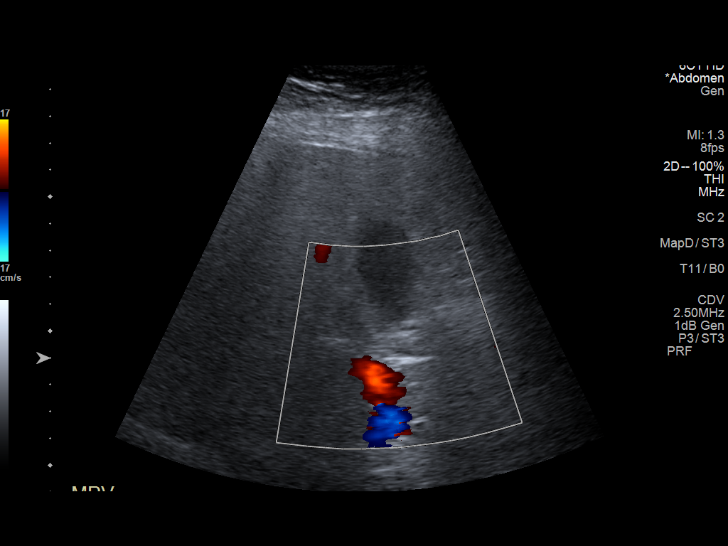

[14 of 25 positions shown; findings below may reference images not displayed]

FINDINGS: Gallbladder:

Cholelithiasis is noted with largest stone measuring 1.7 cm. No
gallbladder wall thickening or pericholecystic fluid is noted. No
sonographic Murphy's sign is noted.

Common bile duct:

Diameter: 2 mm which is within normal limits.

Liver:

No focal lesion identified. Increased echogenicity of hepatic
parenchyma is noted suggesting hepatic steatosis or other diffuse
hepatocellular disease. Portal vein is patent on color Doppler
imaging with normal direction of blood flow towards the liver.

Other: None.
IMPRESSION: Increased echogenicity of hepatic parenchyma is noted suggesting
hepatic steatosis or other diffuse hepatocellular disease.

Cholelithiasis without evidence of cholecystitis.

## 2024-07-05 ENCOUNTER — Encounter: Payer: Self-pay | Admitting: *Deleted

## 2024-07-06 ENCOUNTER — Ambulatory Visit: Attending: Internal Medicine | Admitting: Internal Medicine

## 2024-07-06 ENCOUNTER — Encounter: Payer: Self-pay | Admitting: Internal Medicine

## 2024-07-06 VITALS — BP 116/80 | HR 88 | Ht 60.0 in | Wt 195.0 lb

## 2024-07-06 DIAGNOSIS — E782 Mixed hyperlipidemia: Secondary | ICD-10-CM

## 2024-07-06 DIAGNOSIS — Q969 Turner's syndrome, unspecified: Secondary | ICD-10-CM | POA: Diagnosis not present

## 2024-07-06 DIAGNOSIS — Z86711 Personal history of pulmonary embolism: Secondary | ICD-10-CM

## 2024-07-06 DIAGNOSIS — I700 Atherosclerosis of aorta: Secondary | ICD-10-CM | POA: Diagnosis not present

## 2024-07-06 DIAGNOSIS — Q2381 Bicuspid aortic valve: Secondary | ICD-10-CM

## 2024-07-06 DIAGNOSIS — I77819 Aortic ectasia, unspecified site: Secondary | ICD-10-CM | POA: Diagnosis not present

## 2024-07-06 DIAGNOSIS — I10 Essential (primary) hypertension: Secondary | ICD-10-CM

## 2024-07-06 NOTE — Patient Instructions (Signed)
 Medication Instructions:   No changes  *If you need a refill on your cardiac medications before your next appointment, please call your pharmacy*   Lab Work: Not needed.   Testing/Procedures: Schedule in Sept of 2026 ( 1 year from now)  Your physician has requested that you have an echocardiogram. Echocardiography is a painless test that uses sound waves to create images of your heart. It provides your doctor with information about the size and shape of your heart and how well your heart's chambers and valves are working. This procedure takes approximately one hour. There are no restrictions for this procedure. Please do NOT wear cologne, perfume, aftershave, or lotions (deodorant is allowed). Please arrive 15 minutes prior to your appointment time.  Please note: We ask at that you not bring children with you during ultrasound (echo/ vascular) testing. Due to room size and safety concerns, children are not allowed in the ultrasound rooms during exams. Our front office staff cannot provide observation of children in our lobby area while testing is being conducted. An adult accompanying a patient to their appointment will only be allowed in the ultrasound room at the discretion of the ultrasound technician under special circumstances. We apologize for any inconvenience.     Follow-Up: At [facility], you and your health needs are our priority.  As part of our continuing mission to provide you with exceptional heart care, we have created designated Provider Care Teams.  These Care Teams include your primary Cardiologist (physician) and Advanced Practice Providers (APPs -  Physician Assistants and Nurse Practitioners) who all work together to provide you with the care you need, when you need it.     Your next appointment:   12 month(s) after Echo is completed  The format for your next appointment:   In Person  Provider:   Gayatri A Acharya, MD

## 2024-07-06 NOTE — Progress Notes (Signed)
 Cardiology Office Note:  .   Date:  07/06/2024  ID:  Darice LITTIE Jury, DOB 11/30/1975, MRN [id number] PCP: Dwight Trula SQUIBB, MD  [facility] Providers Cardiologist:  Soyla DELENA Merck, MD    History of Present Illness: Tiffany Cortez is a 47 y.o. female.  Discussed the use of AI scribe software for clinical note transcription with the patient, who gave verbal consent to proceed.  History of Present Illness DENNISE RAABE is a 48 year old female with bicuspid aortic valve and Turner syndrome who presents for a cardiovascular follow-up.  She has mild stenosis of the bicuspid aortic valve and mild dilation of the ascending aorta. Her last echocardiogram was in April 2025. CT angiograms have not shown coarctation of the aorta. Diagnosed with Turner syndrome in childhood after a heart murmur was detected.  A pulmonary embolism occurred ten years ago, treated with Xarelto for three months. There is a family history of blood clots and heart problems, including heart attacks in her paternal grandfather and maternal grandmother. Her mother had a stroke in 08/07/20 and passed away from dementia, possibly vascular-related.  Diabetes mellitus is managed with metformin. She takes rosuvastatin 5 mg for cholesterol, with an LDL of 95 in May, and is making dietary changes to improve cholesterol levels. Lisinopril 20 mg daily is taken for hypertension, with recent blood pressure readings at 116/80.  She has a history of ischemic optic neuropathy, described as a blood clot in her eye, and is not on anticoagulation therapy. No chest pain, shortness of breath, fainting, palpitations, or significant swelling in the ankles. Mild swelling resolves with elevation.    ROS: negative except per HPI above.  Studies Reviewed: Tiffany   EKG Interpretation Date/Time:  Thursday July 06 2024 16:09:56 EDT Ventricular Rate:  88 PR Interval:  170 QRS Duration:  86 QT Interval:  372 QTC Calculation: 450 R  Axis:   10  Text Interpretation: Normal sinus rhythm Normal ECG When compared with ECG of 09-Jul-2023 16:09, No significant change was found Confirmed by Merck Soyla ([id number]) on 07/06/2024 4:57:17 PM    Results LABS LDL: 95 (02/2024)  RADIOLOGY CT Angiography Chest: Normal Aug 07, 2022) Coronary CT: Calcium score 13, minimal plaque in arteries, bicuspid valve, stable aorta (07/2022)  DIAGNOSTIC Echocardiogram: Normal ejection fraction, bicuspid aortic valve with mild stenosis and mild dilation of the ascending aorta (01/2024) EKG: Normal (07/06/2024) Risk Assessment/Calculations:       Physical Exam:   VS:  BP 116/80 (BP Location: Left Arm, Patient Position: Sitting, Cuff Size: Large)   Pulse 88   Ht 5' (1.524 m)   Wt 195 lb (88.5 kg)   SpO2 98%   BMI 38.08 kg/m    Wt Readings from Last 3 Encounters:  07/06/24 195 lb (88.5 kg)  07/09/23 201 lb 12.8 oz (91.5 kg)  07/24/22 204 lb 6.4 oz (92.7 kg)     Physical Exam VITALS: BP- 116/80 GENERAL: Alert, cooperative, well developed, no acute distress. HEENT: Normocephalic, normal oropharynx, moist mucous membranes. CHEST: Clear to auscultation bilaterally, no wheezes, rhonchi, or crackles. CARDIOVASCULAR: Normal heart rate and rhythm, S1 and S2 normal with a faint murmur associated with the bicuspid valve. ABDOMEN: Soft, non-tender, non-distended, without organomegaly, normal bowel sounds. EXTREMITIES: No cyanosis or edema. NEUROLOGICAL: Cranial nerves grossly intact, moves all extremities without gross motor or sensory deficit.   ASSESSMENT AND PLAN: .    Assessment and Plan Assessment & Plan Bicuspid aortic valve with mild stenosis and  mild dilation of ascending aorta in Turner syndrome Bicuspid aortic valve with mild stenosis and mild dilation of the ascending aorta associated with Turner syndrome. Condition is currently mild with no significant progression in recent echocardiogram. No coarctation of the aorta observed in CT  scans. - Schedule echocardiogram in one year to monitor bicuspid aortic valve and aortic dilation.  Hyperlipidemia with minimal coronary artery plaque Hyperlipidemia with LDL at 95 mg/dL and minimal coronary artery plaque. Currently treated with rosuvastatin 5 mg, continue. Dietary modifications discussed to reduce LDL levels below 70 mg/dL to prevent further plaque development. - Encourage dietary modifications to reduce LDL levels, focusing on reducing red meat and saturated fats. - Consider adding Zetia if LDL levels do not decrease with dietary changes. - Monitor cholesterol levels and adjust treatment as necessary.  Essential hypertension Blood pressure is well-controlled with lisinopril 20 mg daily. Current reading is 116/80 mmHg.  Type 2 diabetes mellitus Type 2 diabetes managed with metformin.  Pulmonary embolism and ischemic optic neuropathy Pulmonary embolism 10 years ago, likely related to estrogen therapy. No current anticoagulation therapy. Ischemic optic neuropathy possibly related to clotting issues. Family history of cardiovascular events and potential clotting disorders noted. - Monitor for any signs of recurrent blood clots. - Consider genetic testing for clotting disorders if further clotting events occur.      Soyla Merck, MD, FACC

## 2024-09-27 ENCOUNTER — Other Ambulatory Visit: Payer: Self-pay | Admitting: Internal Medicine

## 2024-09-27 ENCOUNTER — Ambulatory Visit: Admitting: Radiology

## 2024-09-27 ENCOUNTER — Other Ambulatory Visit: Payer: Self-pay

## 2024-09-27 ENCOUNTER — Ambulatory Visit
Admission: EM | Admit: 2024-09-27 | Discharge: 2024-09-27 | Disposition: A | Discharge status: Home / Self Care | Attending: Nurse Practitioner | Admitting: Nurse Practitioner

## 2024-09-27 DIAGNOSIS — M25512 Pain in left shoulder: Secondary | ICD-10-CM

## 2024-09-27 DIAGNOSIS — S42202A Unspecified fracture of upper end of left humerus, initial encounter for closed fracture: Secondary | ICD-10-CM

## 2024-09-27 DIAGNOSIS — Z1231 Encounter for screening mammogram for malignant neoplasm of breast: Secondary | ICD-10-CM

## 2024-09-27 MED ORDER — METHOCARBAMOL 500 MG PO TABS: 500.0000 mg | ORAL_TABLET | Freq: Three times a day (TID) | ORAL | 0 refills | Status: AC | PRN

## 2024-09-27 MED ORDER — IBUPROFEN 800 MG PO TABS
800.0000 mg | ORAL_TABLET | Freq: Once | ORAL | Status: AC
  Administered 2024-09-27: 800 mg via ORAL

## 2024-09-27 MED ORDER — OXYCODONE HCL 5 MG PO TABS: 5.0000 mg | ORAL_TABLET | ORAL | 0 refills | Status: DC | PRN

## 2024-09-27 NOTE — Discharge Instructions (Signed)
 You were seen today for a left shoulder injury after a fall. X-ray confirmed a fracture of the upper arm bone near the shoulder (proximal humerus fracture). This was reviewed with the on-call orthopedic physician, Dr. Barton. Your shoulder has already been properly immobilized in a sling, which you should continue to wear at all times. Do not use or put weight on the left arm until you are evaluated by orthopedics. You were advised to call [facility] first thing in the morning to schedule an urgent appointment with Dr. Selinda Gosling for further evaluation and management. This type of injury will likely require more advanced orthopedic treatment, potentially including shoulder replacement.  For pain control, use the prescribed muscle relaxant and opioid medication exactly as directed. If needed, you may take Tylenol (acetaminophen) 1000 mg every six hours for additional pain relief. This equals two 500 mg tablets at a time. Be careful not to take more than 4000 mg of Tylenol in a 24-hour period. Applying ice to the shoulder for 15-20 minutes at a time several times per day can help with swelling and pain. Keep the arm supported in the sling during all activities and while sleeping, and avoid lifting, pushing, pulling, or reaching with the injured arm.  Go to the emergency department immediately if you develop increasing or uncontrolled pain, numbness or weakness in the arm or hand, blue or pale discoloration of the fingers, increasing swelling, inability to move your fingers, severe tightness in the arm, or any new or worsening symptoms that concern you.

## 2024-09-27 NOTE — ED Provider Notes (Signed)
 [facility]    CSN: [id number] Arrival date & time: 09/27/24  1922      History   Chief Complaint Chief Complaint  Patient presents with   Fall   Shoulder Injury    HPI Tiffany Cortez is a 48 y.o. female.   Discussed the use of AI scribe software for clinical note transcription with the patient, who gave verbal consent to proceed.   The patient presents with left shoulder pain following a fall down stairs that occurred at approximately 6:00 PM this evening. The patient reports tripping and landing on the left shoulder, resulting in immediate pain localized to the left shoulder and upper arm. The pain is exacerbated with movement of the affected arm. The patient notes a mild numbness or altered sensation in the left forearm but states that sensation remains intact. She denies tingling or abnormal sensations in the fingers.  The patient is uncertain whether she struck her head during the fall and is unable to recall a head impact but denies any head or facial pain and notes no visible abrasions or scratches. She denies neck pain, back pain, dizziness, headache, facial numbness or tingling, blurred vision, visual disturbances, nausea, vomiting, dental or oral injuries, or otorrhea. She reports no difficulty with ambulation and no open wounds. She has not taken any medications or attempted any interventions for pain since the injury.  The patient denies use of blood thinners or other routine medications and reports no known drug allergies.  The following sections of the patient's history were reviewed and updated as appropriate: allergies, current medications, past family history, past medical history, past social history, past surgical history, and problem list.      Past Medical History:  Diagnosis Date   Aortic insufficiency    Aortic stenosis    Atypical nevi    Bicuspid doming of aortic cusp    Diabetes mellitus without complication (HCC)    Hypertension    IBS  (irritable bowel syndrome)    MILD   Left ventricular diastolic dysfunction    stage 1   MGUS (monoclonal gammopathy of unknown significance) 06/17/2015   Mild dilation of ascending aorta    Obesity    Pulmonary embolism (HCC)    Pulmonary hyperinflation    Recurrent otitis media    Rosacea    Turner syndrome    Vulvar dystrophy     Patient Active Problem List   Diagnosis Date Noted   Allergic rhinitis 12/28/2016   Atherosclerosis of aorta 12/28/2016   Melanocytic nevus 12/28/2016   Congenital insufficiency of aortic valve 12/28/2016   Calculus of gallbladder without cholecystitis without obstruction 12/28/2016   Fatty metamorphosis of liver 12/28/2016   Personal history of pulmonary embolism 12/28/2016   Irritable bowel syndrome without diarrhea 12/28/2016   Essential (primary) hypertension 12/28/2016   Nonrheumatic aortic valve stenosis 12/28/2016   Morbid obesity (HCC) 12/28/2016   Rosacea 12/28/2016   MGUS (monoclonal gammopathy of unknown significance) 06/17/2015   Encounter for gynecological examination with abnormal finding 05/31/2015   Postmenopausal atrophic vaginitis 05/31/2015    History reviewed. No pertinent surgical history.  OB History   No obstetric history on file.      Home Medications    Prior to Admission medications   Medication Sig Start Date End Date Taking? Authorizing Provider  methocarbamol (ROBAXIN) 500 MG tablet Take 1 tablet (500 mg total) by mouth every 8 (eight) hours as needed for muscle spasms. 09/27/24  Yes Beaux Verne, FNP  oxyCODONE (  ROXICODONE) 5 MG immediate release tablet Take 1 tablet (5 mg total) by mouth every 4 (four) hours as needed for severe pain (pain score 7-10). 09/27/24  Yes Henretter Piekarski, FNP  amoxicillin (AMOXIL) 500 MG capsule  05/23/15   [provider]  Azelaic Acid 15 % gel     [provider]  calcipotriene (DOVONOX) 0.005 % ointment Apply topically as needed. Patient not taking:  Reported on 07/06/2024 01/18/23   [provider]  cholecalciferol (VITAMIN D) 1000 UNITS tablet     [provider]  cycloSPORINE (RESTASIS) 0.05 % ophthalmic emulsion Place 1 drop into both eyes 2 (two) times daily.    [provider]  doxycycline (VIBRA-TABS) 100 MG tablet Take 100 mg by mouth daily. 06/08/24   [provider]  doxycycline (VIBRAMYCIN) 100 MG capsule     [provider]  hydrocortisone 2.5 % ointment Apply topically as needed. 05/19/23   [provider]  lisinopril (PRINIVIL,ZESTRIL) 20 MG tablet 1 tablet 04/05/14   [provider]  metFORMIN (GLUCOPHAGE) 500 MG tablet Take 500 mg by mouth 2 (two) times daily with a meal.    [provider]  metoprolol  tartrate (LOPRESSOR ) 100 MG tablet Take 1 tablet (100 mg total) by mouth once for 1 dose. PLEASE TAKE METOPROLOL  2  HOURS PRIOR TO CTA SCAN. Patient not taking: Reported on 07/24/2022 01/22/22 01/22/22  Alvan Ronal BRAVO, MD  multivitamin-iron-minerals-folic acid (CENTRUM) chewable tablet Chew 1 tablet by mouth daily.    [provider]  rosuvastatin (CRESTOR) 5 MG tablet 1 tablet 01/03/22   [provider]  rosuvastatin (CRESTOR) 5 MG tablet Take 5 mg by mouth daily.    [provider]  Tapinarof (VTAMA) 1 % CREA Apply topically as needed. Patient not taking: Reported on 07/06/2024    [provider]    Family History Family History  Problem Relation Age of Onset   Hypertension Mother    Obesity Father    Hypertension Father    Diabetes Father    Deep vein thrombosis Maternal Grandmother    Colon cancer Maternal Aunt    Breast cancer Neg Hx     Social History Social History   Tobacco Use   Smoking status: Never   Smokeless tobacco: Never  Vaping Use   Vaping status: Never Used  Substance Use Topics   Alcohol use: Never   Drug use: Never     Allergies   Patient has no known allergies.   Review of  Systems Review of Systems  HENT:  Negative for dental problem and ear discharge.   Eyes:  Negative for visual disturbance.  Gastrointestinal:  Negative for nausea and vomiting.  Musculoskeletal:  Positive for arthralgias. Negative for back pain, gait problem and neck pain.  Skin:  Negative for wound.  Neurological:  Positive for numbness (mild to the left forearm). Negative for dizziness, syncope and headaches.  All other systems reviewed and are negative.    Physical Exam Triage Vital Signs ED Triage Vitals  Encounter Vitals Group     BP 09/27/24 1930 131/82     Girls Systolic BP Percentile --      Girls Diastolic BP Percentile --      Boys Systolic BP Percentile --      Boys Diastolic BP Percentile --      Pulse Rate 09/27/24 1930 85     Resp 09/27/24 1930 18     Temp 09/27/24 1930 (!) 97.5 F (36.4 C)  Temp Source 09/27/24 1930 Oral     SpO2 09/27/24 1930 97 %     Weight 09/27/24 1930 195 lb (88.5 kg)     Height 09/27/24 1930 5' (1.524 m)     Head Circumference --      Peak Flow --      Pain Score 09/27/24 1940 9     Pain Loc --      Pain Education --      Exclude from Growth Chart --    No data found.  Updated Vital Signs BP 131/82 (BP Location: Right Arm)   Pulse 85   Temp (!) 97.5 F (36.4 C) (Oral)   Resp 18   Ht 5' (1.524 m)   Wt 195 lb (88.5 kg)   SpO2 97%   BMI 38.08 kg/m   Visual Acuity Right Eye Distance:   Left Eye Distance:   Bilateral Distance:    Right Eye Near:   Left Eye Near:    Bilateral Near:     Physical Exam Vitals reviewed.  Constitutional:      General: She is awake. She is not in acute distress.    Appearance: Normal appearance. She is well-developed and well-groomed. She is not ill-appearing, toxic-appearing or diaphoretic.  HENT:     Head: Normocephalic.     Right Ear: Hearing normal.     Left Ear: Hearing normal.     Nose: Nose normal.     Mouth/Throat:     Mouth: Mucous membranes are moist.  Eyes:     General:  Vision grossly intact.     Conjunctiva/sclera: Conjunctivae normal.  Cardiovascular:     Rate and Rhythm: Normal rate and regular rhythm.     Heart sounds: Normal heart sounds.  Pulmonary:     Effort: Pulmonary effort is normal.     Breath sounds: Normal breath sounds and air entry.  Musculoskeletal:     Left shoulder: Tenderness and crepitus present. No swelling, deformity or bony tenderness. Decreased range of motion. Normal strength.     Left upper arm: Tenderness present. No swelling, edema or deformity.     Left elbow: Normal.     Left forearm: Normal.     Left wrist: Normal.     Left hand: Normal.     Cervical back: Full passive range of motion without pain, normal range of motion and neck supple.     Comments: Left shoulder with significantly decreased range of motion in all directions secondary to pain, with crepitus appreciated on attempted movement. No obvious deformity or gross misalignment is observed. Muscle strength is intact throughout the upper extremity with preserved sensation. Grip strength is strong and equal bilaterally. The patient is unable to perform forearm supination due to pain.   Skin:    General: Skin is warm and dry.  Neurological:     General: No focal deficit present.     Mental Status: She is alert and oriented to person, place, and time.     Sensory: Sensation is intact.  Psychiatric:        Speech: Speech normal.        Behavior: Behavior is cooperative.      UC Treatments / Results  Labs (all labs ordered are listed, but only abnormal results are displayed) Labs Reviewed - No data to display  EKG   Radiology No results found.  Procedures Procedures (including critical care time)  Medications Ordered in UC Medications  ibuprofen (ADVIL) tablet 800 mg (800 mg  Oral Given 09/27/24 2050)    Initial Impression / Assessment and Plan / UC Course  I have reviewed the triage vital signs and the nursing notes.  Pertinent labs & imaging  results that were available during my care of the patient were reviewed by me and considered in my medical decision making (see chart for details).     2106: Patient presents for evaluation of acute traumatic left shoulder pain following a fall down stairs with immediate pain and functional limitation. Examination demonstrated significantly decreased range of motion in all directions with crepitus and inability to supinate the forearm due to pain, while strength, sensation, and distal perfusion remained intact without evidence of gross deformity. Differential diagnosis included shoulder dislocation versus proximal humerus fracture. Imaging of the left shoulder and upper arm was obtained and demonstrated an obvious proximal humerus fracture, with formal radiology interpretation pending. The patient was treated with ibuprofen 800 mg administered in clinic for pain control and was placed in a shoulder immobilizer with sling and instructed to maintain non-weight bearing to the left upper extremity. Orthopedic consultation was initiated with the on-call provider at [facility]) and further management recommendations are pending.   2120: Following review of the imaging, Dr. Barton [facility]) confirmed the presence of a proximal humerus fracture. Immobilization was recommended and had already been initiated with placement of a shoulder immobilizer and sling. Dr. Barton advised that the injury is likely not surgically repairable and may require shoulder replacement. The patient was instructed to contact [facility] in the morning to schedule an urgent follow-up appointment with Dr. Selinda Gosling for definitive orthopedic management. Pain control recommendations include use of a muscle relaxant and opioid analgesic as indicated. The patient was instructed to remain non-weight bearing to the left upper extremity until further orthopedic evaluation.  Today's evaluation has revealed no signs of a  dangerous process. Discussed diagnosis with patient and/or guardian. Patient and/or guardian aware of their diagnosis, possible red flag symptoms to watch out for and need for close follow up. Patient and/or guardian understands verbal and written discharge instructions. Patient and/or guardian comfortable with plan and disposition.  Patient and/or guardian has a clear mental status at this time, good insight into illness (after discussion and teaching) and has clear judgment to make decisions regarding their care  Documentation was completed with the aid of voice recognition software. Transcription may contain typographical errors.  Final Clinical Impressions(s) / UC Diagnoses   Final diagnoses:  Acute pain of left shoulder  Closed fracture of proximal end of left humerus, unspecified fracture morphology, initial encounter     Discharge Instructions      You were seen today for a left shoulder injury after a fall. X-ray confirmed a fracture of the upper arm bone near the shoulder (proximal humerus fracture). This was reviewed with the on-call orthopedic physician, Dr. Barton. Your shoulder has already been properly immobilized in a sling, which you should continue to wear at all times. Do not use or put weight on the left arm until you are evaluated by orthopedics. You were advised to call [facility] first thing in the morning to schedule an urgent appointment with Dr. Selinda Gosling for further evaluation and management. This type of injury will likely require more advanced orthopedic treatment, potentially including shoulder replacement.  For pain control, use the prescribed muscle relaxant and opioid medication exactly as directed. If needed, you may take Tylenol (acetaminophen) 1000 mg every six hours for additional pain relief. This equals two 500 mg tablets  at a time. Be careful not to take more than 4000 mg of Tylenol in a 24-hour period. Applying ice to the shoulder for 15-20 minutes at  a time several times per day can help with swelling and pain. Keep the arm supported in the sling during all activities and while sleeping, and avoid lifting, pushing, pulling, or reaching with the injured arm.  Go to the emergency department immediately if you develop increasing or uncontrolled pain, numbness or weakness in the arm or hand, blue or pale discoloration of the fingers, increasing swelling, inability to move your fingers, severe tightness in the arm, or any new or worsening symptoms that concern you.     ED Prescriptions     Medication Sig Dispense Auth. Provider   methocarbamol (ROBAXIN) 500 MG tablet Take 1 tablet (500 mg total) by mouth every 8 (eight) hours as needed for muscle spasms. 15 tablet Iola Lukes, FNP   oxyCODONE (ROXICODONE) 5 MG immediate release tablet Take 1 tablet (5 mg total) by mouth every 4 (four) hours as needed for severe pain (pain score 7-10). 15 tablet Iola Lukes, FNP      I have reviewed the PDMP during this encounter.   Iola Lukes, [place] 09/27/24 2123

## 2024-09-27 NOTE — ED Triage Notes (Signed)
 Pt presents with a chief complaint of left shoulder injury. States she tumbled down the stairs by accident. Tripped on one of the steps. This occurred at approximately 6 PM tonight. Unable to lift left arm. Currently rates overall pain a 9/10. Describes as dull and aching. Pt does reports that she may have hit her head. Not sure. Happened so fast she cannot remember. Not on blood thinners.

## 2024-09-28 ENCOUNTER — Ambulatory Visit (HOSPITAL_COMMUNITY): Payer: Self-pay

## 2024-09-28 ENCOUNTER — Ambulatory Visit
Admission: RE | Admit: 2024-09-28 | Discharge: 2024-09-28 | Disposition: A | Source: Ambulatory Visit | Attending: Physician Assistant

## 2024-09-28 ENCOUNTER — Other Ambulatory Visit: Payer: Self-pay | Admitting: Physician Assistant

## 2024-09-28 ENCOUNTER — Encounter: Payer: Self-pay | Admitting: Physician Assistant

## 2024-09-28 DIAGNOSIS — S42292A Other displaced fracture of upper end of left humerus, initial encounter for closed fracture: Secondary | ICD-10-CM

## 2024-10-10 ENCOUNTER — Encounter (HOSPITAL_COMMUNITY): Payer: Self-pay | Admitting: Orthopedic Surgery

## 2024-10-10 ENCOUNTER — Other Ambulatory Visit: Payer: Self-pay

## 2024-10-10 NOTE — Progress Notes (Signed)
 Anesthesia Chart Review: Same day workup  48 year old female follows with cardiology for history of bicuspid aortic valve, Turner syndrome, mild aortic stenosis, mild dilation of ascending aorta, PE (~10 years ago, completed Xarelto  3 months).  Echo 01/2024 showed LVEF 55 to 60%, normal RV, mild aortic stenosis with MG 15.6 mmHg, mild dilatation of ascending aorta 41 mm.  Coronary CTA 07/2022 with minimal CAD, bicuspid aortic valve.  CT angiograms have not shown coarctation of the aorta.  Last seen by Dr. Loni on 07/06/2024, stable from cardiac standpoint, no changes to management.  Other pertinent history includes non-insulin -dependent DM2 (A1c 6.7 on 05/19/2024)  Patient will need day of surgery labs and evaluation.  EKG 07/06/2024: NSR.  Rate 88.  TTE 02/04/2024:  1. Left ventricular ejection fraction, by estimation, is 55 to 60%. The  left ventricle has normal function. The left ventricle has no regional  wall motion abnormalities. Left ventricular diastolic parameters were  normal.   2. Right ventricular systolic function is normal. The right ventricular  size is normal.   3. The mitral valve is grossly normal. Trivial mitral valve  regurgitation. No evidence of mitral stenosis.   4. The aortic valve is bicuspid. There is mild calcification of the  aortic valve. There is mild thickening of the aortic valve. Aortic valve  regurgitation is not visualized. Mild aortic valve stenosis. Aortic valve  mean gradient measures 15.6 mmHg.   5. Aortic dilatation noted. There is mild dilatation of the ascending  aorta, measuring 41 mm.   Comparison(s): Prior images reviewed side by side. Changes from prior  study are noted. Ascending aorta now measures 4.1 cm. Noted as 40 mm on  echo and 38 mm on CT in 2023.   Coronary CTA 08/21/2022:  IMPRESSION: 1. Coronary calcium score of 13.2. This was 94th percentile for age-, sex, and race-matched controls.   2. Normal coronary origin with left  dominance.   3. Minimal calcified plaque in the proximal LAD (<25%).   4. Minimal non-calcified plaque (<25%) in the LCX/RCA.   5. Bicuspid aortic valve with fusion of the RCC/LCC with raphe (Sievers type 1).   6. 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     Lynwood Geofm RIGGERS [facility] Short Stay Center/Anesthesiology Phone [phone] 10/10/2024 12:50 PM

## 2024-10-10 NOTE — Anesthesia Preprocedure Evaluation (Signed)
 Anesthesia Evaluation  Patient identified by MRN, date of birth, ID band Patient awake    Reviewed: Allergy & Precautions, NPO status , Patient's Chart, lab work & pertinent test results  Airway Mallampati: II  TM Distance: >3 FB Neck ROM: Full    Dental no notable dental hx.    Pulmonary PE   Pulmonary exam normal        Cardiovascular hypertension, Pt. on medications  Rhythm:Regular Rate:Normal + Systolic murmurs ECHO: IMPRESSIONS   1. Left ventricular ejection fraction, by estimation, is 55 to 60%. The left ventricle has normal function. The left ventricle has no regional wall motion abnormalities. Left ventricular diastolic parameters were normal.  2. Right ventricular systolic function is normal. The right ventricular size is normal.  3. The mitral valve is grossly normal. Trivial mitral valve regurgitation. No evidence of mitral stenosis.  4. The aortic valve is bicuspid. There is mild calcification of the aortic valve. There is mild thickening of the aortic valve. Aortic valve regurgitation is not visualized. Mild aortic valve stenosis. Aortic valve mean gradient measures 15.6 mmHg.  5. Aortic dilatation noted. There is mild dilatation of the ascending aorta, measuring 41 mm.   Comparison(s): Prior images reviewed side by side. Changes from prior study are noted. Ascending aorta now measures 4.1 cm. Noted as 40 mm on echo and 38 mm on CT in 2023.    Neuro/Psych negative neurological ROS  negative psych ROS   GI/Hepatic negative GI ROS, Neg liver ROS,,,  Endo/Other  diabetes, Type 2, Oral Hypoglycemic Agents    Renal/GU negative Renal ROS  negative genitourinary   Musculoskeletal Humerus fx   Abdominal Normal abdominal exam  (+)   Peds  Hematology Lab Results      Component                Value               Date                      WBC                      5.3                 10/11/2024                HGB                       14.2                10/11/2024                HCT                      40.9                10/11/2024                MCV                      92.5                10/11/2024                PLT                      220  10/11/2024             Lab Results      Component                Value               Date                      NA                       136                 10/11/2024                K                        3.7                 10/11/2024                CO2                      21 (L)              10/11/2024                GLUCOSE                  150 (H)             10/11/2024                BUN                      13                  10/11/2024                CREATININE               0.54                10/11/2024                CALCIUM                  9.7                 10/11/2024                EGFR                     101                 08/19/2022                GFRNONAA                 >60                 10/11/2024              Anesthesia Other Findings   Reproductive/Obstetrics                              Anesthesia Physical Anesthesia Plan  ASA: 3  Anesthesia Plan: General and Regional   Post-op Pain Management: Regional block*  Induction: Intravenous  PONV Risk Score and Plan: 3 and Ondansetron , Dexamethasone , Midazolam  and Treatment may vary due to age or medical condition  Airway Management Planned: Mask and Oral ETT  Additional Equipment: None  Intra-op Plan:   Post-operative Plan: Extubation in OR  Informed Consent: I have reviewed the patients History and Physical, chart, labs and discussed the procedure including the risks, benefits and alternatives for the proposed anesthesia with the patient or authorized representative who has indicated his/her understanding and acceptance.     Dental advisory given  Plan Discussed with: CRNA  Anesthesia Plan Comments: (PAT note by Lynwood Hope, PA-C: 48 year old female follows with cardiology for history of bicuspid aortic valve, Turner syndrome, mild aortic stenosis, mild dilation of ascending aorta, PE (~10 years ago, completed Xarelto  3 months).  Echo 01/2024 showed LVEF 55 to 60%, normal RV, mild aortic stenosis with MG 15.6 mmHg, mild dilatation of ascending aorta 41 mm.  Coronary CTA 07/2022 with minimal CAD, bicuspid aortic valve.  CT angiograms have not shown coarctation of the aorta.  Last seen by Dr. Loni on 07/06/2024, stable from cardiac standpoint, no changes to management.  Other pertinent history includes non-insulin -dependent DM2 (A1c 6.7 on 05/19/2024)  Patient will need day of surgery labs and evaluation.  EKG 07/06/2024: NSR.  Rate 88.  TTE 02/04/2024: 1. Left ventricular ejection fraction, by estimation, is 55 to 60%. The  left ventricle has normal function. The left ventricle has no regional  wall motion abnormalities. Left ventricular diastolic parameters were  normal.  2. Right ventricular systolic function is normal. The right ventricular  size is normal.  3. The mitral valve is grossly normal. Trivial mitral valve  regurgitation. No evidence of mitral stenosis.  4. The aortic valve is bicuspid. There is mild calcification of the  aortic valve. There is mild thickening of the aortic valve. Aortic valve  regurgitation is not visualized. Mild aortic valve stenosis. Aortic valve  mean gradient measures 15.6 mmHg.  5. Aortic dilatation noted. There is mild dilatation of the ascending  aorta, measuring 41 mm.   Comparison(s): Prior images reviewed side by side. Changes from prior  study are noted. Ascending aorta now measures 4.1 cm. Noted as 40 mm on  echo and 38 mm on CT in 2023.   Coronary CTA 08/21/2022:  IMPRESSION: 1. Coronary calcium score of 13.2. This was 94th percentile for age-, sex, and race-matched controls.  2. Normal coronary origin with left dominance.  3. Minimal calcified  plaque in the proximal LAD (<25%).  4. Minimal non-calcified plaque (<25%) in the LCX/RCA.  5. Bicuspid aortic valve with fusion of the RCC/LCC with raphe (Sievers type 1).  6. 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  )         Anesthesia Quick Evaluation

## 2024-10-10 NOTE — Progress Notes (Signed)
 SDW CALL  Patient was given pre-op instructions over the phone. The opportunity was given for the patient to ask questions. No further questions asked. Patient verbalized understanding of instructions given.   PCP - Dwight Trula SQUIBB, MD  Cardiologist - Loni Soyla LABOR, MD   PPM/ICD - denies Device Orders -  Rep Notified -   Chest x-ray - na EKG -07/06/24  Stress Test - denies ECHO - 02/04/24 Cardiac Cath - denies  Sleep Study - denies CPAP -   Fasting Blood Sugar - pt does not check her blood sugar at home. Hold Metformin DOS Checks Blood Sugar _____ times a day  Blood Thinner Instructions: Aspirin Instructions:  ERAS Protcol -clears [id number] PRE-SURGERY Ensure or G2- no  COVID TEST- na   Anesthesia review: yes hx PE,HTN,aortic stenosis,DM2   Patient denies shortness of breath, fever, cough and chest pain over the phone call. Pt is taking Doxycyline for ocular rosacea;not for other infection.   Special instructions:    Oral Hygiene is also important to reduce your risk of infection.  Remember - BRUSH YOUR TEETH THE MORNING OF SURGERY WITH YOUR REGULAR TOOTHPASTE

## 2024-10-11 ENCOUNTER — Ambulatory Visit (HOSPITAL_COMMUNITY)

## 2024-10-11 ENCOUNTER — Ambulatory Visit (HOSPITAL_COMMUNITY): Payer: Self-pay | Admitting: Physician Assistant

## 2024-10-11 ENCOUNTER — Encounter (HOSPITAL_COMMUNITY): Payer: Self-pay | Admitting: Orthopedic Surgery

## 2024-10-11 ENCOUNTER — Ambulatory Visit (HOSPITAL_COMMUNITY)
Admission: RE | Admit: 2024-10-11 | Discharge: 2024-10-11 | Disposition: A | Attending: Orthopedic Surgery | Admitting: Orthopedic Surgery

## 2024-10-11 ENCOUNTER — Encounter: Admission: RE | Disposition: A | Payer: Self-pay | Attending: Orthopedic Surgery

## 2024-10-11 DIAGNOSIS — S42202D Unspecified fracture of upper end of left humerus, subsequent encounter for fracture with routine healing: Secondary | ICD-10-CM

## 2024-10-11 DIAGNOSIS — Z833 Family history of diabetes mellitus: Secondary | ICD-10-CM | POA: Insufficient documentation

## 2024-10-11 DIAGNOSIS — S42202A Unspecified fracture of upper end of left humerus, initial encounter for closed fracture: Secondary | ICD-10-CM | POA: Diagnosis present

## 2024-10-11 DIAGNOSIS — Z6838 Body mass index (BMI) 38.0-38.9, adult: Secondary | ICD-10-CM | POA: Diagnosis not present

## 2024-10-11 DIAGNOSIS — Z8249 Family history of ischemic heart disease and other diseases of the circulatory system: Secondary | ICD-10-CM | POA: Insufficient documentation

## 2024-10-11 DIAGNOSIS — I358 Other nonrheumatic aortic valve disorders: Secondary | ICD-10-CM | POA: Insufficient documentation

## 2024-10-11 DIAGNOSIS — I119 Hypertensive heart disease without heart failure: Secondary | ICD-10-CM | POA: Insufficient documentation

## 2024-10-11 DIAGNOSIS — W19XXXA Unspecified fall, initial encounter: Secondary | ICD-10-CM | POA: Diagnosis not present

## 2024-10-11 DIAGNOSIS — E119 Type 2 diabetes mellitus without complications: Secondary | ICD-10-CM | POA: Insufficient documentation

## 2024-10-11 DIAGNOSIS — I10 Essential (primary) hypertension: Secondary | ICD-10-CM

## 2024-10-11 DIAGNOSIS — Z7984 Long term (current) use of oral hypoglycemic drugs: Secondary | ICD-10-CM | POA: Diagnosis not present

## 2024-10-11 DIAGNOSIS — I77819 Aortic ectasia, unspecified site: Secondary | ICD-10-CM | POA: Diagnosis not present

## 2024-10-11 DIAGNOSIS — I352 Nonrheumatic aortic (valve) stenosis with insufficiency: Secondary | ICD-10-CM | POA: Insufficient documentation

## 2024-10-11 DIAGNOSIS — Z79899 Other long term (current) drug therapy: Secondary | ICD-10-CM | POA: Diagnosis not present

## 2024-10-11 HISTORY — PX: ORIF HUMERUS FRACTURE: SHX2126

## 2024-10-11 LAB — BASIC METABOLIC PANEL WITH GFR
Anion gap: 15 (ref 5–15)
BUN: 13 mg/dL (ref 6–20)
CO2: 21 mmol/L — ABNORMAL LOW (ref 22–32)
Calcium: 9.7 mg/dL (ref 8.9–10.3)
Chloride: 101 mmol/L (ref 98–111)
Creatinine, Ser: 0.54 mg/dL (ref 0.44–1.00)
GFR, Estimated: >60 mL/min (ref >60)
Glucose, Bld: 150 mg/dL — ABNORMAL HIGH (ref 70–99)
Potassium: 3.7 mmol/L (ref 3.5–5.1)
Sodium: 136 mmol/L (ref 135–145)

## 2024-10-11 LAB — CBC
HCT: 40.9 % (ref 36.0–46.0)
Hemoglobin: 14.2 g/dL (ref 12.0–15.0)
MCH: 32.1 pg (ref 26.0–34.0)
MCHC: 34.7 g/dL (ref 30.0–36.0)
MCV: 92.5 fL (ref 80.0–100.0)
Platelets: 220 K/uL (ref 150–400)
RBC: 4.42 MIL/uL (ref 3.87–5.11)
RDW: 11.9 % (ref 11.5–15.5)
WBC: 5.3 K/uL (ref 4.0–10.5)
nRBC: 0 % (ref 0.0–0.2)

## 2024-10-11 LAB — GLUCOSE, CAPILLARY
Glucose-Capillary: 161 mg/dL — ABNORMAL HIGH (ref 70–99)
Glucose-Capillary: 140 mg/dL — ABNORMAL HIGH (ref 70–99)
Glucose-Capillary: 147 mg/dL — ABNORMAL HIGH (ref 70–99)
Glucose-Capillary: 130 mg/dL — ABNORMAL HIGH (ref 70–99)

## 2024-10-11 SURGERY — OPEN REDUCTION INTERNAL FIXATION (ORIF) PROXIMAL HUMERUS FRACTURE
Anesthesia: Regional | Laterality: Left

## 2024-10-11 MED ORDER — FENTANYL CITRATE (PF) 100 MCG/2ML IJ SOLN: 25.0000 ug | INTRAMUSCULAR | Status: DC | PRN

## 2024-10-11 MED ORDER — DEXAMETHASONE SOD PHOSPHATE PF 10 MG/ML IJ SOLN
INTRAMUSCULAR | Status: DC | PRN
  Administered 2024-10-11: 16:00:00 5 mg via INTRAVENOUS

## 2024-10-11 MED ORDER — TRANEXAMIC ACID-NACL 1000-0.7 MG/100ML-% IV SOLN
1000.0000 mg | INTRAVENOUS | Status: AC
  Administered 2024-10-11: 16:00:00 1000 mg via INTRAVENOUS
  Filled 2024-10-11: qty 100

## 2024-10-11 MED ORDER — PROPOFOL 10 MG/ML IV BOLUS
INTRAVENOUS | Status: AC
  Filled 2024-10-11: qty 20

## 2024-10-11 MED ORDER — BUPIVACAINE HCL (PF) 0.5 % IJ SOLN
INTRAMUSCULAR | Status: DC | PRN
  Administered 2024-10-11: 14:00:00 10 mL via PERINEURAL

## 2024-10-11 MED ORDER — INSULIN ASPART 100 UNIT/ML IJ SOLN: 0.0000 [IU] | INTRAMUSCULAR | Status: DC | PRN

## 2024-10-11 MED ORDER — RIVAROXABAN 10 MG PO TABS: 10.0000 mg | ORAL_TABLET | Freq: Every day | ORAL | 0 refills | Status: AC

## 2024-10-11 MED ORDER — INSULIN ASPART 100 UNIT/ML IJ SOLN
0.0000 [IU] | INTRAMUSCULAR | Status: AC | PRN
  Administered 2024-10-11: 17:00:00 2 [IU] via SUBCUTANEOUS

## 2024-10-11 MED ORDER — CHLORHEXIDINE GLUCONATE 0.12 % MT SOLN
OROMUCOSAL | Status: AC
  Administered 2024-10-11: 13:00:00 15 mL via OROMUCOSAL
  Filled 2024-10-11: qty 15

## 2024-10-11 MED ORDER — SUGAMMADEX SODIUM 200 MG/2ML IV SOLN
INTRAVENOUS | Status: DC | PRN
  Administered 2024-10-11: 18:00:00 200 mg via INTRAVENOUS

## 2024-10-11 MED ORDER — MIDAZOLAM HCL 2 MG/2ML IJ SOLN
INTRAMUSCULAR | Status: AC
  Administered 2024-10-11: 15:00:00 2 mg via INTRAVENOUS
  Filled 2024-10-11: qty 2

## 2024-10-11 MED ORDER — CHLORHEXIDINE GLUCONATE 0.12 % MT SOLN: 15.0000 mL | Freq: Once | OROMUCOSAL | Status: AC

## 2024-10-11 MED ORDER — FENTANYL CITRATE (PF) 100 MCG/2ML IJ SOLN: 50.0000 ug | Freq: Once | INTRAMUSCULAR | Status: AC

## 2024-10-11 MED ORDER — BUPIVACAINE-EPINEPHRINE (PF) 0.25% -1:200000 IJ SOLN
INTRAMUSCULAR | Status: AC
  Filled 2024-10-11: qty 30

## 2024-10-11 MED ORDER — CEFAZOLIN SODIUM-DEXTROSE 2-4 GM/100ML-% IV SOLN
2.0000 g | INTRAVENOUS | Status: AC
  Administered 2024-10-11: 16:00:00 2 g via INTRAVENOUS
  Filled 2024-10-11: qty 100

## 2024-10-11 MED ORDER — FENTANYL CITRATE (PF) 100 MCG/2ML IJ SOLN
INTRAMUSCULAR | Status: DC | PRN
  Administered 2024-10-11: 18:00:00 50 ug via INTRAVENOUS

## 2024-10-11 MED ORDER — OXYCODONE HCL 5 MG PO TABS: 5.0000 mg | ORAL_TABLET | ORAL | 0 refills | Status: AC | PRN

## 2024-10-11 MED ORDER — ORAL CARE MOUTH RINSE: 15.0000 mL | Freq: Once | OROMUCOSAL | Status: AC

## 2024-10-11 MED ORDER — CHLORHEXIDINE GLUCONATE 0.12 % MT SOLN: 15.0000 mL | OROMUCOSAL | Status: AC

## 2024-10-11 MED ORDER — ONDANSETRON HCL 4 MG/2ML IJ SOLN
INTRAMUSCULAR | Status: DC | PRN
  Administered 2024-10-11: 17:00:00 4 mg via INTRAVENOUS

## 2024-10-11 MED ORDER — BUPIVACAINE LIPOSOME 1.3 % IJ SUSP
INTRAMUSCULAR | Status: DC | PRN
  Administered 2024-10-11: 14:00:00 10 mL via PERINEURAL

## 2024-10-11 MED ORDER — INSULIN ASPART 100 UNIT/ML IJ SOLN
INTRAMUSCULAR | Status: AC
  Administered 2024-10-11: 14:00:00 2 [IU] via SUBCUTANEOUS
  Filled 2024-10-11: qty 2

## 2024-10-11 MED ORDER — DROPERIDOL 2.5 MG/ML IJ SOLN: 0.6250 mg | Freq: Once | INTRAMUSCULAR | Status: DC | PRN

## 2024-10-11 MED ORDER — LACTATED RINGERS IV SOLN: INTRAVENOUS | Status: DC

## 2024-10-11 MED ORDER — ACETAMINOPHEN 10 MG/ML IV SOLN: 1000.0000 mg | Freq: Once | INTRAVENOUS | Status: DC | PRN

## 2024-10-11 MED ORDER — VANCOMYCIN HCL 1000 MG IV SOLR
INTRAVENOUS | Status: DC | PRN
  Administered 2024-10-11: 17:00:00 1000 mg via TOPICAL

## 2024-10-11 MED ORDER — ONDANSETRON 4 MG PO TBDP: 4.0000 mg | ORAL_TABLET | Freq: Three times a day (TID) | ORAL | 0 refills | Status: AC | PRN

## 2024-10-11 MED ORDER — SODIUM CHLORIDE 0.9 % IR SOLN
Status: DC | PRN
  Administered 2024-10-11: 17:00:00 1000 mL

## 2024-10-11 MED ORDER — VANCOMYCIN HCL 1000 MG IV SOLR
INTRAVENOUS | Status: AC
  Filled 2024-10-11: qty 20

## 2024-10-11 MED ORDER — FENTANYL CITRATE (PF) 100 MCG/2ML IJ SOLN
INTRAMUSCULAR | Status: AC
  Administered 2024-10-11: 15:00:00 50 ug via INTRAVENOUS
  Filled 2024-10-11: qty 2

## 2024-10-11 MED ORDER — MIDAZOLAM HCL (PF) 2 MG/2ML IJ SOLN: 2.0000 mg | Freq: Once | INTRAMUSCULAR | Status: AC

## 2024-10-11 MED ADMIN — Lidocaine HCl Local Soln Prefilled Syringe 100 MG/5ML (2%): 100 mg | INTRAVENOUS | @ 16:00:00 | NDC 70004072309

## 2024-10-11 MED ADMIN — PROPOFOL 200 MG/20ML IV EMUL: 150 mg | INTRAVENOUS | @ 16:00:00 | NDC 00069020910

## 2024-10-11 MED ADMIN — Rocuronium Bromide IV Soln Pref Syr 100 MG/10ML (10 MG/ML): 60 mg | INTRAVENOUS | @ 16:00:00 | NDC 99999070048

## 2024-10-11 MED FILL — Fentanyl Citrate Preservative Free (PF) Inj 100 MCG/2ML: INTRAMUSCULAR | Qty: 2 | Status: AC

## 2024-10-11 SURGICAL SUPPLY — 55 items
BAG COUNTER SPONGE SURGICOUNT (BAG) ×1 IMPLANT
BIT DRILL 3.2XCALB NS DISP (BIT) IMPLANT
BIT DRILL CALIBRATED 2.7 (BIT) IMPLANT
CLSR STERI-STRIP ANTIMIC 1/2X4 (GAUZE/BANDAGES/DRESSINGS) IMPLANT
COVER SURGICAL LIGHT HANDLE (MISCELLANEOUS) ×1 IMPLANT
DRAPE INCISE IOBAN 66X45 STRL (DRAPES) ×1 IMPLANT
DRAPE SURG ORHT 6 SPLT 77X108 (DRAPES) ×2 IMPLANT
DRAPE U-SHAPE 47X51 STRL (DRAPES) ×1 IMPLANT
DRSG AQUACEL AG ADV 3.5X 4 (GAUZE/BANDAGES/DRESSINGS) IMPLANT
DRSG AQUACEL AG ADV 3.5X 6 (GAUZE/BANDAGES/DRESSINGS) IMPLANT
DRSG AQUACEL AG ADV 3.5X10 (GAUZE/BANDAGES/DRESSINGS) IMPLANT
DURAPREP 26ML APPLICATOR (WOUND CARE) ×1 IMPLANT
ELECTRODE REM PT RTRN 9FT ADLT (ELECTROSURGICAL) ×1 IMPLANT
GAUZE PAD ABD 8X10 STRL (GAUZE/BANDAGES/DRESSINGS) ×1 IMPLANT
GLOVE BIO SURGEON STRL SZ7.5 (GLOVE) ×2 IMPLANT
GLOVE BIOGEL PI IND STRL 8 (GLOVE) ×2 IMPLANT
GOWN STRL REUS W/ TWL LRG LVL3 (GOWN DISPOSABLE) ×1 IMPLANT
GOWN STRL REUS W/ TWL XL LVL3 (GOWN DISPOSABLE) ×2 IMPLANT
GRAFT BNE CANC CHIPS 1-8 20CC (Bone Implant) IMPLANT
KIT BASIN OR (CUSTOM PROCEDURE TRAY) ×1 IMPLANT
KIT TURNOVER KIT B (KITS) ×1 IMPLANT
KWIRE 2X5 SS THRDED S3 (WIRE) IMPLANT
MANIFOLD NEPTUNE II (INSTRUMENTS) ×1 IMPLANT
NDL SUT .5 MAYO 1.404X.05X (NEEDLE) IMPLANT
PACK SHOULDER (CUSTOM PROCEDURE TRAY) ×1 IMPLANT
PAD ARMBOARD POSITIONER FOAM (MISCELLANEOUS) ×2 IMPLANT
PEG LOCKING 3.2MMX24MM (Peg) IMPLANT
PEG LOCKING 3.2MMX44 (Peg) IMPLANT
PEG LOCKING 3.2X34 (Screw) IMPLANT
PEG LOCKING 3.2X36 (Screw) IMPLANT
PEG LOCKING 3.2X38 (Screw) IMPLANT
PEG LOCKING 3.2X40 (Peg) IMPLANT
PLATE PROX HUMERUS 4H LEFT LOW (Plate) IMPLANT
SCREW LOCK CORT STAR 3.5X28 (Screw) IMPLANT
SCREW LOW PROF TIS 3.5X28MM (Screw) IMPLANT
SCREW LP NL T15 3.5X22 (Screw) IMPLANT
SCREW LP NL T15 3.5X24 (Screw) IMPLANT
SCREW PEG LOCK 3.2X30MM (Screw) IMPLANT
SLEEVE MEASURING 3.2 (BIT) IMPLANT
SLING ARM FOAM STRAP LRG (SOFTGOODS) IMPLANT
SLING ARM FOAM STRAP MED (SOFTGOODS) IMPLANT
SOLN 0.9% NACL POUR BTL 1000ML (IV SOLUTION) ×1 IMPLANT
SOLN STERILE WATER BTL 1000 ML (IV SOLUTION) ×1 IMPLANT
SPONGE T-LAP 18X18 ~~LOC~~+RFID (SPONGE) IMPLANT
SPONGE T-LAP 4X18 ~~LOC~~+RFID (SPONGE) IMPLANT
STRIP CLOSURE SKIN 1/2X4 (GAUZE/BANDAGES/DRESSINGS) IMPLANT
SUCTION TUBE FRAZIER 10FR DISP (SUCTIONS) ×1 IMPLANT
SUT BROADBAND TAPE 2PK 2.3 (SUTURE) IMPLANT
SUT MNCRL AB 4-0 PS2 18 (SUTURE) IMPLANT
SUT VIC AB 2-0 CT1 TAPERPNT 27 (SUTURE) ×1 IMPLANT
SUTURE FIBERWR #2 38 T-5 BLUE (SUTURE) IMPLANT
SYR CONTROL 10ML LL (SYRINGE) IMPLANT
TOWEL GREEN STERILE (TOWEL DISPOSABLE) ×1 IMPLANT
TOWEL GREEN STERILE FF (TOWEL DISPOSABLE) ×1 IMPLANT
YANKAUER SUCT BULB TIP NO VENT (SUCTIONS) ×1 IMPLANT

## 2024-10-11 NOTE — Anesthesia Procedure Notes (Signed)
 Anesthesia Regional Block: Interscalene brachial plexus block   Pre-Anesthetic Checklist: , timeout performed,  Correct Patient, Correct Site, Correct Laterality,  Correct Procedure, Correct Position, site marked,  Risks and benefits discussed,  Surgical consent,  Pre-op evaluation,  At surgeon's request and post-op pain management  Laterality: Left  Prep: Dura Prep       Needles:  Injection technique: Single-shot  Needle Type: Echogenic Stimulator Needle     Needle Length: 5cm  Needle Gauge: 20     Additional Needles:   Procedures:,,,, ultrasound used (permanent image in chart),,    Narrative:  Start time: 10/11/2024 2:15 PM End time: 10/11/2024 2:20 PM Injection made incrementally with aspirations every 5 mL.  Performed by: Personally  Anesthesiologist: Dorethea Cordella SQUIBB, DO  Additional Notes: Patient identified. Risks/Benefits/Options discussed with patient including but not limited to bleeding, infection, nerve damage, failed block, incomplete pain control. Patient expressed understanding and wished to proceed. All questions were answered. Sterile technique was used throughout the entire procedure. Please see nursing notes for vital signs. Aspirated in 5cc intervals with injection for negative confirmation. Patient was given instructions on fall risk and not to get out of bed. All questions and concerns addressed with instructions to call with any issues or inadequate analgesia.

## 2024-10-11 NOTE — H&P (Signed)
 ORTHOPAEDIC H and P  REQUESTING PHYSICIAN: Sharl Selinda Dover, MD  PCP:  Dwight Trula SQUIBB, MD  Chief Complaint: Left proximal humerus fracture HPI: Tiffany Cortez is a 48 y.o. female who complains of  left shoulder pain and dysfunction.  Here today for ORIF left shoulder.  No new complaints.  Past Medical History:  Diagnosis Date   Aortic insufficiency    Aortic stenosis    Atypical nevi    Bicuspid doming of aortic cusp    Diabetes mellitus without complication (HCC)    Hypertension    IBS (irritable bowel syndrome)    MILD   Left ventricular diastolic dysfunction    stage 1   MGUS (monoclonal gammopathy of unknown significance) 06/17/2015   Mild dilation of ascending aorta    Obesity    Pulmonary embolism (HCC)    Pulmonary hyperinflation    Recurrent otitis media    Rosacea    Turner syndrome    Vulvar dystrophy    History reviewed. No pertinent surgical history. Social History   Socioeconomic History   Marital status: Single    Spouse name: Not on file   Number of children: Not on file   Years of education: Not on file   Highest education level: Not on file  Occupational History   Not on file  Tobacco Use   Smoking status: Never   Smokeless tobacco: Never  Vaping Use   Vaping status: Never Used  Substance and Sexual Activity   Alcohol use: Never   Drug use: Never   Sexual activity: Not on file  Other Topics Concern   Not on file  Social History Narrative   Not on file   Social Drivers of Health   Tobacco Use: Low Risk (10/10/2024)   Patient History    Smoking Tobacco Use: Never    Smokeless Tobacco Use: Never    Passive Exposure: Not on file  Financial Resource Strain: Not on file  Food Insecurity: Not on file  Transportation Needs: Not on file  Physical Activity: Not on file  Stress: Not on file  Social Connections: Not on file  Depression (PHQ2-9): Low Risk (10/24/2021)   Depression (PHQ2-9)    PHQ-2 Score: 0  Alcohol Screen: Not on  file  Housing: Not on file  Utilities: Not on file  Health Literacy: Not on file   Family History  Problem Relation Age of Onset   Hypertension Mother    Obesity Father    Hypertension Father    Diabetes Father    Deep vein thrombosis Maternal Grandmother    Colon cancer Maternal Aunt    Breast cancer Neg Hx    Allergies[1] Prior to Admission medications  Medication Sig Start Date End Date Taking? Authorizing Provider  acetaminophen  (TYLENOL ) 500 MG tablet Take 1,000 mg by mouth every 8 (eight) hours as needed for moderate pain (pain score 4-6) or mild pain (pain score 1-3).   Yes [provider]  amoxicillin (AMOXIL) 500 MG capsule  05/23/15  Yes [provider]  doxycycline (ADOXA) 50 MG tablet Take 50 mg by mouth daily. 08/23/24 08/23/25 Yes [provider]  lisinopril (PRINIVIL,ZESTRIL) 20 MG tablet Take 20 mg by mouth daily. 04/05/14  Yes [provider]  metFORMIN (GLUCOPHAGE-XR) 500 MG 24 hr tablet Take 500 mg by mouth 2 (two) times daily with a meal.   Yes [provider]  oxyCODONE  (ROXICODONE ) 5 MG immediate release tablet Take 1 tablet (5 mg total) by mouth  every 4 (four) hours as needed for severe pain (pain score 7-10). Patient taking differently: Take 5 mg by mouth every 8 (eight) hours as needed for severe pain (pain score 7-10). 09/27/24  Yes Iola Lukes, FNP  rosuvastatin (CRESTOR) 5 MG tablet Take 5 mg by mouth daily. 01/03/22  Yes [provider]  rosuvastatin (CRESTOR) 5 MG tablet Take 5 mg by mouth daily.   Yes [provider]  Azelaic Acid 15 % gel     [provider]  calcipotriene (DOVONOX) 0.005 % ointment Apply topically as needed. Patient not taking: No sig reported 01/18/23   [provider]  cholecalciferol (VITAMIN D) 1000 UNITS tablet Take 1,000 Units by mouth 2 (two) times daily. Patient not taking: Reported on 10/06/2024    [provider]  cycloSPORINE  (RESTASIS) 0.05 % ophthalmic emulsion Place 1 drop into both eyes 2 (two) times daily. Patient not taking: Reported on 10/06/2024    [provider]  hydrocortisone 2.5 % ointment Apply topically as needed. Patient not taking: Reported on 10/06/2024 05/19/23   [provider]  methocarbamol  (ROBAXIN ) 500 MG tablet Take 1 tablet (500 mg total) by mouth every 8 (eight) hours as needed for muscle spasms. Patient not taking: Reported on 10/06/2024 09/27/24   Iola Lukes, FNP  Multiple Vitamins-Minerals (CENTRUM ULTRA WOMENS PO) Take 1 tablet by mouth daily. Patient not taking: Reported on 10/06/2024    [provider]  Tapinarof (VTAMA) 1 % CREA Apply topically as needed. Patient not taking: No sig reported    [provider]   No results found.  Positive ROS: All other systems have been reviewed and were otherwise negative with the exception of those mentioned in the HPI and as above.  Physical Exam: General: Alert, no acute distress Cardiovascular: No pedal edema Respiratory: No cyanosis, no use of accessory musculature GI: No organomegaly, abdomen is soft and non-tender Skin: No lesions in the area of chief complaint Neurologic: Sensation intact distally Psychiatric: Patient is competent for consent with normal mood and affect Lymphatic: No axillary or cervical lymphadenopathy  MUSCULOSKELETAL: LUE-  brusing in axilla.  NVI.  No open wounds.  Assessment: Left 3 part closed proximal humerus fracture  Plan: - to OR to day for orif  - The risks, benefits, and alternatives were discussed with the patient. There are risks associated with the surgery including, but not limited to, problems with anesthesia (death), infection, differences in leg length/angulation/rotation, fracture of bones, loosening or failure of implants, malunion, nonunion, hematoma (blood accumulation) which may require surgical drainage, blood clots, pulmonary embolism, nerve  injury (foot drop), and blood vessel injury. The patient understands these risks and elects to proceed.   Dc home post op    Selinda Belvie Gosling, MD Cell [phone]    10/11/2024 2:15 PM     [1] No Known Allergies

## 2024-10-11 NOTE — Discharge Instructions (Addendum)
 Orthopedic surgery discharge instructions:  -Maintain postoperative bandage until your follow-up appointment.  This bandage is waterproof and you may shower and that bandage beginning on postoperative day #2.  Please do not submerge the shoulder underwater.  -Maintain your sling at all times unless performing activities of daily living or school/desk work.  No lifting above shoulder height for the first 2 weeks.  You may move the elbow, hand and wrist as tolerated.  Again and you are free to use the right arm for activities of daily living such as eating, getting dressed, and bathing and doing schoolwork.  -Apply ice to the operative shoulder along the surgical bandage for 20 to 30 minutes out of each hour.  Do this as frequently as possible throughout the day.  -For mild to moderate pain you should use Tylenol  and or Advil  in alternating fashion.  Do this throughout the day and utilize hydrocodone as needed for breakthrough pain. you should try to wean off of the hydrocodone over the next 3-5 days.  -You will return to see Dr. Sharl in the office in 2 weeks for postoperative x-rays and wound check.  - Given your history of venous thromboembolism/pulmonary embolism you will need to take Xarelto , 10 mg daily x 1 month postoperatively.  You should begin this on Friday, December 19.

## 2024-10-11 NOTE — Op Note (Signed)
 10/11/2024  6:12 PM  PATIENT:  Tiffany Cortez    PRE-OPERATIVE DIAGNOSIS:  Left shoulder proximal humerus fracture  POST-OPERATIVE DIAGNOSIS:  Same  PROCEDURE: , Left OPEN REDUCTION INTERNAL FIXATION (ORIF) PROXIMAL HUMERUS FRACTURE  SURGEON:  Selinda Belvie Gosling, MD  PHYSICIAN ASSISTANT: Dayle Moores, PA-C, present and scrubbed throughout the case, critical for completion in a timely fashion, and for retraction, instrumentation, and closure.  ANESTHESIA:   General Exparel  interscalene  PREOPERATIVE INDICATIONS:  Tiffany Cortez is a  48 y.o. female with a diagnosis of Left shoulder proximal humerus fracture who elected for surgical management.    The risks benefits and alternatives were discussed with the patient including but not limited to the risks of nonoperative treatment, versus surgical intervention including infection, bleeding, nerve injury, malunion, nonunion, the need for revision surgery, hardware prominence, hardware failure, the need for hardware removal, blood clots, cardiopulmonary complications, conversion to arthroplasty, morbidity, mortality, among others, and they were willing to proceed.  Predicted outcome is good, although there will be at least a six to nine month expected recovery.   OPERATIVE IMPLANTS: Biomet ALPS proximal humerus locking plate. 20 cc of cancellous bone chips  OPERATIVE FINDINGS: Displaced proximal humerus fracture.  With significant marginal impaction and proximal metaphyseal bone loss as well as bone loss of the lateral humeral head as the shaft component had excavated some of that bone when she fell into varus collapse.  OPERATIVE PROCEDURE: The patient was brought to the operating room and placed in the supine position. General anesthesia was administered. IV antibiotics were given. She was placed in the beach chair position. All bony prominences were padded. The upper extremity was prepped and draped in usual sterile fashion. Deltopectoral  incision was performed.  I exposed the fracture site, and placed deep retractors. I did  tenotomize the biceps tendon. This was left in place. I elevated a small portion of the deltoid off of the shaft, in order to gain access for the plate. I placed supraspinatus and subscapularis stitches, and then reduced the head onto the shaft. This was maintained in satisfactory position.  It was very apparent however, that there was significant marginal impaction and bone loss of the humeral head.  This was mostly resulting in the varus position of the humeral head for the last 2 weeks.  I applied the plate and secured it into the sliding hole first. I confirmed position of the reduction and the plate with C-arm, and I placed a total of 2 guidewires into the appropriate position in the head. I was satisfied that the plate was distal appropriately, and then secured the plate proximally with smooth pegs, taking care to prevent penetration into the arch articular surface, using C-arm, as well as manual feel using a hand drill.  We did also pack about 10 to 12 cc of cancellous bone chips into the impaction bone void of the humeral head.  I then secured the plate distally using another cortical screw. I then passed the FiberWire sutures from the subscapularis and supraspinatus through the plate and secured the tuberosities. Once complete fixation and reduction of been achieved, took final C-arm pictures, and irrigated the wounds copiously, and repaired the deltopectoral interval with Vicryl followed by Vicryl for the subcutaneous tissue with Monocryl and Steri-Strips for the skin. She was placed in a sling. She had a preoperative regional block as well. She tolerated the procedure well with no complications.   Disposition:  She will be discharged home today from PACU.  She will be in her sling and nonweightbearing for 6 weeks.  We will let her begin using the arm for activities of daily living immediately with the sling  on, and then can remove the sling at 2 weeks and do some things at chest and waist height.  But no lifting x 6 weeks.  Will get AP and scapular Y x-ray at follow-up.  She will also begin Xarelto , 10 mg, once daily x 1 month starting on Friday, December 19.

## 2024-10-11 NOTE — Anesthesia Procedure Notes (Signed)
 Procedure Name: Intubation Date/Time: 10/11/2024 4:03 PM  Performed by: Delores Dus, CRNAPre-anesthesia Checklist: Patient identified, Emergency Drugs available, Suction available and Patient being monitored Patient Re-evaluated:Patient Re-evaluated prior to induction Oxygen Delivery Method: Circle system utilized Preoxygenation: Pre-oxygenation with 100% oxygen Induction Type: IV induction Ventilation: Mask ventilation without difficulty Laryngoscope Size: Mac and 3 Grade View: Grade I Tube type: Oral Tube size: 7.0 mm Number of attempts: 1 Airway Equipment and Method: Stylet Placement Confirmation: ETT inserted through vocal cords under direct vision, positive ETCO2 and breath sounds checked- equal and bilateral Secured at: 21 cm Tube secured with: Tape Dental Injury: Teeth and Oropharynx as per pre-operative assessment

## 2024-10-11 NOTE — Anesthesia Postprocedure Evaluation (Signed)
 Anesthesia Post Note  Patient: Tiffany Cortez  Procedure(s) Performed: OPEN REDUCTION INTERNAL FIXATION (ORIF) PROXIMAL HUMERUS FRACTURE (Left)     Patient location during evaluation: PACU Anesthesia Type: Regional and General Level of consciousness: awake Pain management: pain level controlled Vital Signs Assessment: post-procedure vital signs reviewed and stable Respiratory status: spontaneous breathing, nonlabored ventilation and respiratory function stable Cardiovascular status: blood pressure returned to baseline and stable Postop Assessment: no apparent nausea or vomiting Anesthetic complications: no   No notable events documented.  Last Vitals:  Vitals:   10/11/24 1845 10/11/24 1900  BP: (!) 130/91 126/86  Pulse: 89 93  Resp: 17 19  Temp:    SpO2: 96% 94%    Last Pain:  Vitals:   10/11/24 1900  TempSrc:   PainSc: 2                  Delon Aisha Arch

## 2024-10-11 NOTE — Brief Op Note (Signed)
 10/11/2024  6:06 PM  PATIENT:  Darice CROME Corprew  48 y.o. female  PRE-OPERATIVE DIAGNOSIS:  Left shoulder proximal humerus fracture  POST-OPERATIVE DIAGNOSIS:  Left shoulder proximal humerus fracture  PROCEDURE:  Procedures: OPEN REDUCTION INTERNAL FIXATION (ORIF) PROXIMAL HUMERUS FRACTURE (Left)  SURGEON:  Surgeons and Role:    * Sharl Selinda Dover, MD - Primary  PHYSICIAN ASSISTANT: Dayle Moores, PA-C ANESTHESIA:   regional and general  EBL:  150 mL   BLOOD ADMINISTERED:none  DRAINS: none   LOCAL MEDICATIONS USED:  NONE  SPECIMEN:  No Specimen  DISPOSITION OF SPECIMEN:  N/A  COUNTS:  YES  TOURNIQUET:  * No tourniquets in log *  DICTATION: .Note written in EPIC  PLAN OF CARE: Discharge to home after PACU  PATIENT DISPOSITION:  PACU - hemodynamically stable.   Delay start of Pharmacological VTE agent (>24hrs) due to surgical blood loss or risk of bleeding: not applicable

## 2024-10-11 NOTE — Transfer of Care (Signed)
 Immediate Anesthesia Transfer of Care Note  Patient: Tiffany Cortez  Procedure(s) Performed: OPEN REDUCTION INTERNAL FIXATION (ORIF) PROXIMAL HUMERUS FRACTURE (Left)  Patient Location: PACU  Anesthesia Type:General  Level of Consciousness: awake and sedated  Airway & Oxygen Therapy: Patient Spontanous Breathing and Patient connected to face mask oxygen  Post-op Assessment: Report given to RN and Post -op Vital signs reviewed and stable  Post vital signs: Reviewed and stable  Last Vitals:  Vitals Value Taken Time  BP 137/98 10/11/24 18:01  Temp    Pulse 96 10/11/24 18:05  Resp 20 10/11/24 18:05  SpO2 100 % 10/11/24 18:05  Vitals shown include unfiled device data.  Last Pain:  Vitals:   10/11/24 1440  TempSrc:   PainSc: 0-No pain      Patients Stated Pain Goal: 0 (10/11/24 1307)  Complications: No notable events documented.

## 2024-10-12 ENCOUNTER — Encounter (HOSPITAL_COMMUNITY): Payer: Self-pay | Admitting: Orthopedic Surgery

## 2024-11-09 ENCOUNTER — Ambulatory Visit

## 2025-01-11 ENCOUNTER — Ambulatory Visit
